# Patient Record
Sex: Female | Born: 1960 | Race: White | Hispanic: No | Smoking: Current every day smoker
Health system: Southern US, Community
[De-identification: ages and names within clinical notes are randomized; demographics above are authoritative.]

## PROBLEM LIST (undated history)

## (undated) DIAGNOSIS — F32A Depression, unspecified: Secondary | ICD-10-CM

## (undated) DIAGNOSIS — F419 Anxiety disorder, unspecified: Secondary | ICD-10-CM

## (undated) DIAGNOSIS — F329 Major depressive disorder, single episode, unspecified: Secondary | ICD-10-CM

---

## 2000-11-21 ENCOUNTER — Other Ambulatory Visit: Admission: RE | Admit: 2000-11-21 | Discharge: 2000-11-21 | Payer: Self-pay | Admitting: Family Medicine

## 2001-01-23 ENCOUNTER — Encounter: Admission: RE | Admit: 2001-01-23 | Discharge: 2001-02-16 | Payer: Self-pay | Admitting: Family Medicine

## 2002-02-25 ENCOUNTER — Other Ambulatory Visit: Admission: RE | Admit: 2002-02-25 | Discharge: 2002-02-25 | Payer: Self-pay | Admitting: Obstetrics & Gynecology

## 2002-04-20 ENCOUNTER — Encounter: Payer: Self-pay | Admitting: Emergency Medicine

## 2002-04-20 ENCOUNTER — Emergency Department (HOSPITAL_COMMUNITY): Admission: EM | Admit: 2002-04-20 | Discharge: 2002-04-20 | Payer: Self-pay | Admitting: Emergency Medicine

## 2004-03-16 ENCOUNTER — Other Ambulatory Visit: Admission: RE | Admit: 2004-03-16 | Discharge: 2004-03-16 | Payer: Self-pay | Admitting: Obstetrics & Gynecology

## 2005-04-01 ENCOUNTER — Ambulatory Visit: Payer: Self-pay | Admitting: Psychiatry

## 2005-04-27 ENCOUNTER — Emergency Department (HOSPITAL_COMMUNITY): Admission: EM | Admit: 2005-04-27 | Discharge: 2005-04-27 | Payer: Self-pay | Admitting: Emergency Medicine

## 2005-04-27 ENCOUNTER — Inpatient Hospital Stay (HOSPITAL_COMMUNITY): Admission: RE | Admit: 2005-04-27 | Discharge: 2005-05-03 | Payer: Self-pay | Admitting: Psychiatry

## 2017-05-15 ENCOUNTER — Inpatient Hospital Stay (HOSPITAL_COMMUNITY)
Admission: AD | Admit: 2017-05-15 | Discharge: 2017-05-18 | DRG: 885 | Disposition: A | Discharge status: Home / Self Care | Payer: Medicare Other | Source: Intra-hospital | Attending: Psychiatry | Admitting: Psychiatry

## 2017-05-15 ENCOUNTER — Encounter (HOSPITAL_COMMUNITY): Payer: Self-pay | Admitting: Emergency Medicine

## 2017-05-15 ENCOUNTER — Emergency Department (HOSPITAL_COMMUNITY)
Admission: EM | Admit: 2017-05-15 | Discharge: 2017-05-15 | Disposition: A | Discharge status: Other Acute Inpatient Hospital | Payer: Medicare Other | Attending: Emergency Medicine | Admitting: Emergency Medicine

## 2017-05-15 ENCOUNTER — Encounter (HOSPITAL_COMMUNITY): Payer: Self-pay

## 2017-05-15 ENCOUNTER — Other Ambulatory Visit: Payer: Self-pay

## 2017-05-15 DIAGNOSIS — Z791 Long term (current) use of non-steroidal anti-inflammatories (NSAID): Secondary | ICD-10-CM

## 2017-05-15 DIAGNOSIS — F332 Major depressive disorder, recurrent severe without psychotic features: Secondary | ICD-10-CM | POA: Diagnosis present

## 2017-05-15 DIAGNOSIS — Z79899 Other long term (current) drug therapy: Secondary | ICD-10-CM | POA: Insufficient documentation

## 2017-05-15 DIAGNOSIS — F419 Anxiety disorder, unspecified: Secondary | ICD-10-CM | POA: Insufficient documentation

## 2017-05-15 DIAGNOSIS — G4700 Insomnia, unspecified: Secondary | ICD-10-CM | POA: Diagnosis present

## 2017-05-15 DIAGNOSIS — F1910 Other psychoactive substance abuse, uncomplicated: Secondary | ICD-10-CM | POA: Diagnosis not present

## 2017-05-15 DIAGNOSIS — R45851 Suicidal ideations: Secondary | ICD-10-CM | POA: Diagnosis present

## 2017-05-15 DIAGNOSIS — Z87891 Personal history of nicotine dependence: Secondary | ICD-10-CM | POA: Diagnosis not present

## 2017-05-15 DIAGNOSIS — F329 Major depressive disorder, single episode, unspecified: Secondary | ICD-10-CM | POA: Diagnosis present

## 2017-05-15 DIAGNOSIS — F1010 Alcohol abuse, uncomplicated: Secondary | ICD-10-CM | POA: Diagnosis not present

## 2017-05-15 DIAGNOSIS — F1290 Cannabis use, unspecified, uncomplicated: Secondary | ICD-10-CM | POA: Diagnosis present

## 2017-05-15 DIAGNOSIS — F10230 Alcohol dependence with withdrawal, uncomplicated: Secondary | ICD-10-CM | POA: Diagnosis present

## 2017-05-15 DIAGNOSIS — R450 Nervousness: Secondary | ICD-10-CM | POA: Diagnosis not present

## 2017-05-15 DIAGNOSIS — F1210 Cannabis abuse, uncomplicated: Secondary | ICD-10-CM | POA: Diagnosis not present

## 2017-05-15 DIAGNOSIS — Z046 Encounter for general psychiatric examination, requested by authority: Secondary | ICD-10-CM | POA: Diagnosis not present

## 2017-05-15 HISTORY — DX: Major depressive disorder, single episode, unspecified: F32.9

## 2017-05-15 HISTORY — DX: Anxiety disorder, unspecified: F41.9

## 2017-05-15 HISTORY — DX: Depression, unspecified: F32.A

## 2017-05-15 LAB — COMPREHENSIVE METABOLIC PANEL
ALBUMIN: 3.7 g/dL (ref 3.5–5.0)
ALK PHOS: 57 U/L (ref 38–126)
ALT: 10 U/L — AB (ref 14–54)
AST: 19 U/L (ref 15–41)
Anion gap: 8 (ref 5–15)
CALCIUM: 8.9 mg/dL (ref 8.9–10.3)
CHLORIDE: 100 mmol/L — AB (ref 101–111)
CO2: 27 mmol/L (ref 22–32)
CREATININE: 0.81 mg/dL (ref 0.44–1.00)
GFR calc non Af Amer: >60 mL/min (ref >60)
Glucose, Bld: 96 mg/dL (ref 65–99)
Potassium: 3.8 mmol/L (ref 3.5–5.1)
SODIUM: 135 mmol/L (ref 135–145)
Total Bilirubin: 0.8 mg/dL (ref 0.3–1.2)
Total Protein: 7.1 g/dL (ref 6.5–8.1)

## 2017-05-15 LAB — CBC
HEMATOCRIT: 40.9 % (ref 36.0–46.0)
HEMOGLOBIN: 14.6 g/dL (ref 12.0–15.0)
MCH: 32.5 pg (ref 26.0–34.0)
MCHC: 35.7 g/dL (ref 30.0–36.0)
MCV: 91.1 fL (ref 78.0–100.0)
Platelets: 186 10*3/uL (ref 150–400)
RBC: 4.49 MIL/uL (ref 3.87–5.11)
RDW: 12.2 % (ref 11.5–15.5)
WBC: 4.8 10*3/uL (ref 4.0–10.5)

## 2017-05-15 LAB — RAPID URINE DRUG SCREEN, HOSP PERFORMED
AMPHETAMINES: NOT DETECTED
BARBITURATES: NOT DETECTED
Benzodiazepines: POSITIVE — AB
COCAINE: NOT DETECTED
OPIATES: NOT DETECTED
TETRAHYDROCANNABINOL: POSITIVE — AB

## 2017-05-15 LAB — SALICYLATE LEVEL

## 2017-05-15 LAB — ETHANOL: Alcohol, Ethyl (B): <10 mg/dL (ref <10)

## 2017-05-15 LAB — ACETAMINOPHEN LEVEL: Acetaminophen (Tylenol), Serum: <10 ug/mL — ABNORMAL LOW (ref 10–30)

## 2017-05-15 MED ORDER — HYDROXYZINE HCL 25 MG PO TABS: 25.0000 mg | ORAL_TABLET | Freq: Four times a day (QID) | ORAL | Status: DC | PRN

## 2017-05-15 MED ORDER — ALUM & MAG HYDROXIDE-SIMETH 200-200-20 MG/5ML PO SUSP: 30.0000 mL | ORAL | Status: DC | PRN

## 2017-05-15 MED ORDER — THIAMINE HCL 100 MG/ML IJ SOLN: 100.0000 mg | Freq: Once | INTRAMUSCULAR | Status: DC

## 2017-05-15 MED ORDER — LORAZEPAM 1 MG PO TABS: 1.0000 mg | ORAL_TABLET | Freq: Four times a day (QID) | ORAL | Status: DC | PRN

## 2017-05-15 MED ORDER — HYDROXYZINE HCL 25 MG PO TABS
25.0000 mg | ORAL_TABLET | Freq: Four times a day (QID) | ORAL | Status: DC | PRN
  Administered 2017-05-16: 25 mg via ORAL
  Filled 2017-05-15: qty 1

## 2017-05-15 MED ORDER — LOPERAMIDE HCL 2 MG PO CAPS: 2.0000 mg | ORAL_CAPSULE | ORAL | Status: DC | PRN

## 2017-05-15 MED ORDER — CLONIDINE HCL 0.1 MG PO TABS
0.1000 mg | ORAL_TABLET | Freq: Four times a day (QID) | ORAL | Status: DC
  Filled 2017-05-15 (×2): qty 1

## 2017-05-15 MED ORDER — ADULT MULTIVITAMIN W/MINERALS CH
1.0000 | ORAL_TABLET | Freq: Every day | ORAL | Status: DC
  Administered 2017-05-16 – 2017-05-18 (×3): 1 via ORAL
  Filled 2017-05-15 (×5): qty 1

## 2017-05-15 MED ORDER — TERBINAFINE HCL 250 MG PO TABS
250.0000 mg | ORAL_TABLET | Freq: Every day | ORAL | Status: DC
  Filled 2017-05-15: qty 1

## 2017-05-15 MED ORDER — CLONIDINE HCL 0.1 MG PO TABS: 0.1000 mg | ORAL_TABLET | Freq: Every day | ORAL | Status: DC

## 2017-05-15 MED ORDER — ATORVASTATIN CALCIUM 20 MG PO TABS
20.0000 mg | ORAL_TABLET | Freq: Every day | ORAL | Status: DC
  Filled 2017-05-15: qty 1

## 2017-05-15 MED ORDER — NAPROXEN 500 MG PO TABS: 500.0000 mg | ORAL_TABLET | Freq: Two times a day (BID) | ORAL | Status: DC | PRN

## 2017-05-15 MED ORDER — TRAZODONE HCL 100 MG PO TABS: 200.0000 mg | ORAL_TABLET | Freq: Every day | ORAL | Status: DC

## 2017-05-15 MED ORDER — ONDANSETRON 4 MG PO TBDP: 4.0000 mg | ORAL_TABLET | Freq: Four times a day (QID) | ORAL | Status: DC | PRN

## 2017-05-15 MED ORDER — VITAMIN B-1 100 MG PO TABS
100.0000 mg | ORAL_TABLET | Freq: Every day | ORAL | Status: DC
  Administered 2017-05-16 – 2017-05-18 (×3): 100 mg via ORAL
  Filled 2017-05-15 (×5): qty 1

## 2017-05-15 MED ORDER — IBUPROFEN 600 MG PO TABS
600.0000 mg | ORAL_TABLET | Freq: Four times a day (QID) | ORAL | Status: DC | PRN
  Administered 2017-05-15 – 2017-05-17 (×4): 600 mg via ORAL
  Filled 2017-05-15 (×4): qty 1

## 2017-05-15 MED ORDER — DICYCLOMINE HCL 20 MG PO TABS: 20.0000 mg | ORAL_TABLET | Freq: Four times a day (QID) | ORAL | Status: DC | PRN

## 2017-05-15 MED ORDER — TRAZODONE HCL 100 MG PO TABS
200.0000 mg | ORAL_TABLET | Freq: Every evening | ORAL | Status: DC | PRN
  Administered 2017-05-15: 200 mg via ORAL
  Filled 2017-05-15: qty 2

## 2017-05-15 MED ORDER — METHOCARBAMOL 500 MG PO TABS: 500.0000 mg | ORAL_TABLET | Freq: Three times a day (TID) | ORAL | Status: DC | PRN

## 2017-05-15 MED ORDER — CLONIDINE HCL 0.1 MG PO TABS: 0.1000 mg | ORAL_TABLET | ORAL | Status: DC

## 2017-05-15 MED ORDER — ACETAMINOPHEN 325 MG PO TABS: 650.0000 mg | ORAL_TABLET | Freq: Four times a day (QID) | ORAL | Status: DC | PRN

## 2017-05-15 MED ORDER — TERBINAFINE HCL 250 MG PO TABS
250.0000 mg | ORAL_TABLET | Freq: Every day | ORAL | Status: AC
  Administered 2017-05-16 – 2017-05-17 (×2): 250 mg via ORAL
  Filled 2017-05-15 (×2): qty 1

## 2017-05-15 MED ORDER — MAGNESIUM HYDROXIDE 400 MG/5ML PO SUSP: 30.0000 mL | Freq: Every day | ORAL | Status: DC | PRN

## 2017-05-15 MED ORDER — VORTIOXETINE HBR 5 MG PO TABS
1.0000 | ORAL_TABLET | Freq: Two times a day (BID) | ORAL | Status: DC
  Filled 2017-05-15: qty 1

## 2017-05-15 NOTE — ED Triage Notes (Signed)
Patient [place] [address] [place] her husband and drove in to [place] today to see her doctor who sent her to the ED for suicidal ideation.  Patient reports she has had depression and anxiety all her life and takes her medication, but it recently has stopped working.  Denies AV hallucinations or homicidal ideation.

## 2017-05-15 NOTE — Progress Notes (Signed)
Patient ID: Rebecca Wilson, female   DOB: [birth year], 56 y.o.   MRN: [id number] Per State regulations 482.30 this chart was reviewed for medical necessity with respect to the patient's admission/duration of stay.    Next review date: 05/19/17  Thurman CoyerEric Annete Ayuso, BSN, RN-BC  Case Manager

## 2017-05-15 NOTE — ED Notes (Signed)
Patient moved to room 34 in acute area.  Belongings given to psych tech.

## 2017-05-15 NOTE — BH Assessment (Signed)
[facility] Assessment Progress Note  Case was staffed with Lewis FNP who recommended a inpatient admission as appropriate bed placement is investigated.     

## 2017-05-15 NOTE — Tx Team (Signed)
Initial Treatment Plan 05/15/2017 7:26 PM Radene JourneyKay L Rachal ZOX:[id number]    PATIENT STRESSORS: Loss of two infants Other: step son living with her and her husband   PATIENT STRENGTHS: Capable of independent living Licensed conveyancerCommunication skills Financial means Physical Health   PATIENT IDENTIFIED PROBLEMS: Depression  Suicidal ideation  Substance abuse  "I just don't want to be depressed anymore"               DISCHARGE CRITERIA:  Improved stabilization in mood, thinking, and/or behavior Need for constant or close observation no longer present Verbal commitment to aftercare and medication compliance Withdrawal symptoms are absent or subacute and managed without 24-hour nursing intervention  PRELIMINARY DISCHARGE PLAN: Outpatient therapy Medication management  PATIENT/FAMILY INVOLVEMENT: This treatment plan has been presented to and reviewed with the patient, Radene JourneyKay L Winemiller.  The patient and family have been given the opportunity to ask questions and make suggestions.  Levin BaconHeather V Gaetano Romberger, RN 05/15/2017, 7:26 PM

## 2017-05-15 NOTE — BH Assessment (Addendum)
Assessment Note  Rebecca Wilson is an 56 y.o. female that presents this date with thoughts of self harm with intent but no plan. Patient is observed to be very tearful on presentation and speaks in a low soft voice. Patient is oriented to time/place and denies any H/I or AVH. Patient admits to three prior attempts at self harm and was last hospitalized in 2006 for a suicide attempt. Patient is very vague in reference to details and time frame associated with the two other attempts. Patient is a poor historian and renders conflicting history at times stating "I have been sick so long I can't remember anything anymore." Patient states she has been receiving services from Rebecca Wilson who assists with medication management. Patient cannot recall how long she has been with that provider but states "over 10 years." Patient denies having a therapist currently but reports she has had therapy "in the past" to assist with issues associated with depression. Patient is unable to identify any specific stressors this date stating she "just thinks her medications have stopped working." Patient reports she went to a scheduled appointment this date and informed Rebecca Wilson that she was feeling suicidal and was advised to present to [facility] to be evlauted. Patient is requesting a voluntary admission to assist with stabilization. Patient cannot currently contract for safety. Patient states she is currently compliant with her medication regimen but reports worsening symptoms of depression to include: frequent crying episodes, anhedonia and isolating. Patient reports a ongoing SA history to include daily alcohol for the last "few months" stating she consumes 4 to 6 beers daily with last use on 05/14/17 when she consumed 4 beers. Patient denies any other illicit SA use but tested positive for Cannabis this date. Patient denies any current withdrawals. Per notes, patient lives on [address] [place] and [place] in [place] today to see her  doctor who sent her to the ED for suicidal ideation. Patient reports she has had depression and anxiety all her life and takes her medication, but it recently has stopped working. Denies AV hallucinations or homicidal ideation. Case was staffed with Rebecca NethLewis Wilson who recommended a inpatient admission as appropriate bed placement is investigated.    Diagnosis: F33.2 MDD recurrent severe without psychotic features Alcohol use, Cannabis use   Past Medical History:  Past Medical History:  Diagnosis Date  . Anxiety   . Depression      Family History: No family history on file.  Social History:  has no tobacco, alcohol, and drug history on file.  Additional Social History:  Alcohol / Drug Use Pain Medications: See MAR Prescriptions: See MAR Over the Counter: See MAR History of alcohol / drug use?: Yes Longest period of sobriety (when/how long): Unknown Negative Consequences of Use: (Denies) Withdrawal Symptoms: (Denies) Substance #1 Name of Substance 1: Alcohol  1 - Age of First Use: 21 1 - Amount (size/oz): 6 to 8 12 oz beers 1 - Frequency: Daily 1 - Duration: Pt is vague in reference to time frame 1 - Last Use / Amount: 05/15/17 4 12  oz beers  CIWA: CIWA-Ar BP: 120/67 Pulse Rate: 61 COWS:    Allergies: No Known Allergies  Home Medications:  (Not in a hospital admission)  OB/GYN Status:  No LMP recorded. Patient has had a hysterectomy.  General Assessment Data [facility] of [facility]: [facility] TTS Assessment: In system Is this a Tele or Face-to-Face Assessment?: Face-to-Face Is this an Initial Assessment or a Re-assessment for this encounter?:  Initial Assessment Marital status: Married Villas name: NA Is patient pregnant?: No Pregnancy Status: No Living Arrangements: Spouse/significant other Can pt return to current living arrangement?: Yes Admission Status: Voluntary Is patient capable of signing voluntary admission?: Yes Referral Source: Self/Family/Friend Insurance  type: Medicare  Medical Screening Exam [facility] Walk-in ONLY) Medical Exam completed: Yes  Crisis Care Plan Living Arrangements: Spouse/significant other Legal Guardian: (NA) Name of Psychiatrist: [name] Wilson Name of Therapist: None  Education Status Is patient currently in school?: No Current Grade: (NA) Highest grade of school patient has completed: (12) Name of school: (NA) Contact person: (NA)  Risk to self with the past 6 months Suicidal Ideation: Yes-Currently Present Has patient been a risk to self within the past 6 months prior to admission? : No Suicidal Intent: Yes-Currently Present Has patient had any suicidal intent within the past 6 months prior to admission? : No Is patient at risk for suicide?: Yes Suicidal Plan?: No Has patient had any suicidal plan within the past 6 months prior to admission? : No Access to Means: No What has been your use of drugs/alcohol within the last 12 months?: Current use Previous Attempts/Gestures: Yes How many times?: 3 Other Self Harm Risks: NA Triggers for Past Attempts: Unknown Intentional Self Injurious Behavior: None Family Suicide History: Unknown Recent stressful life event(s): Other (Comment)(Pt states meds are no longer working) Persecutory voices/beliefs?: No Depression: Yes Depression Symptoms: Fatigue, Feeling worthless/self pity Substance abuse history and/or treatment for substance abuse?: Yes Suicide prevention information given to non-admitted patients: Not applicable  Risk to Others within the past 6 months Homicidal Ideation: No Does patient have any lifetime risk of violence toward others beyond the six months prior to admission? : No Thoughts of Harm to Others: No Current Homicidal Intent: No Current Homicidal Plan: No Access to Homicidal Means: No Identified Victim: NA History of harm to others?: No Assessment of Violence: None Noted Violent Behavior Description: NA Does patient have access to weapons?:  No Criminal Charges Pending?: No Does patient have a court date: No Is patient on probation?: No  Psychosis Hallucinations: None noted Delusions: None noted  Mental Status Report Appearance/Hygiene: In scrubs Eye Contact: Fair Motor Activity: Freedom of movement Speech: Soft, Slow Level of Consciousness: Quiet/awake Mood: Depressed Affect: Appropriate to circumstance Anxiety Level: Moderate Thought Processes: Coherent, Relevant Judgement: Unimpaired Orientation: Person, Place, Time Obsessive Compulsive Thoughts/Behaviors: None  Cognitive Functioning Concentration: Decreased Memory: Recent Intact, Remote Intact IQ: Average Insight: Fair Impulse Control: Fair Appetite: Poor Weight Loss: 0 Weight Gain: 0 Sleep: No Change Total Hours of Sleep: 6 Vegetative Symptoms: None  ADLScreening [facility] Assessment Services) Patient's cognitive ability adequate to safely complete daily activities?: Yes Patient able to express need for assistance with ADLs?: Yes Independently performs ADLs?: Yes (appropriate for developmental age)  Prior Inpatient Therapy Prior Inpatient Therapy: Yes Prior Therapy Dates: 2006 Prior Therapy Facilty/Provider([facility] Reason for Treatment: MH issues  Prior Outpatient Therapy Prior Outpatient Therapy: Yes Prior Therapy Dates: Ongoing Prior Therapy Facilty/Provider(s): Rebecca Kettle Wilson Reason for Treatment: Med mang Does patient have an ACCT team?: No Does patient have Intensive In-House Services?  : No Does patient have [facility] services? : No Does patient have P4CC services?: No  ADL Screening (condition at time of admission) Patient's cognitive ability adequate to safely complete daily activities?: Yes Is the patient deaf or have difficulty hearing?: No Does the patient have difficulty seeing, even when wearing glasses/contacts?: No Does the patient have difficulty concentrating, remembering, or making decisions?: No Patient  able to express need for  assistance with ADLs?: Yes Does the patient have difficulty dressing or bathing?: No Independently performs ADLs?: Yes (appropriate for developmental age) Does the patient have difficulty walking or climbing stairs?: No Weakness of Legs: None Weakness of Arms/Hands: None  Home Assistive Devices/Equipment Home Assistive Devices/Equipment: None  Therapy Consults (therapy consults require a physician order) PT Evaluation Needed: No OT Evalulation Needed: No SLP Evaluation Needed: No Abuse/Neglect Assessment (Assessment to be complete while patient is alone) Physical Abuse: Denies Verbal Abuse: Denies Sexual Abuse: Denies Exploitation of patient/patient's resources: Denies Self-Neglect: Denies Values / Beliefs Cultural Requests During Hospitalization: None Spiritual Requests During Hospitalization: None Consults Spiritual Care Consult Needed: No Social Work Consult Needed: No Merchant navy officerAdvance Directives (For Healthcare) Does Patient Have a Medical Advance Directive?: No Would patient like information on creating a medical advance directive?: No - Patient declined    Additional Information 1:1 In Past 12 Months?: No CIRT Risk: No Elopement Risk: No Does patient have medical clearance?: No     Disposition: Case was staffed with Rebecca NethLewis Wilson who recommended a inpatient admission as appropriate bed placement is investigated.  Disposition Initial Assessment Completed for this Encounter: Yes Disposition of Patient: Inpatient treatment program Type of inpatient treatment program: Adult  On Site Evaluation by:   Reviewed with Physician:    Alfredia Fergusonavid L Nahara Dona 05/15/2017 4:21 PM

## 2017-05-15 NOTE — ED Notes (Signed)
Pt oriented to room and unit. Pt is tearful and soft spoken.  Pt contracts for safety.  Pt denies S/I, H/I, and AVH.  15 minute checks and video monitoring in place.

## 2017-05-15 NOTE — Progress Notes (Signed)
Patient ID: Rebecca Wilson, female   DOB: Apr 26, 1961, 56 y.o.   MRN: [id number]  Pt currently presents with a worried affect and anxious behavior. Pt has concerns about being started on medications for her chronic hip pain and sleep. Pt speaks to her husband on the phone tonight. Pt reports good sleep with current medication regimen.   Pt provided with medications per providers orders. Pt's labs and vitals were monitored throughout the night. Pt given a 1:1 about emotional and mental status. Pt supported and encouraged to express concerns and questions. Pt educated on medications. Provider notified of patients concerns, see MAR.   Pt's safety ensured with 15 minute and environmental checks. Pt currently denies SI/HI and A/V hallucinations. Pt verbally agrees to seek staff if SI/HI or A/VH occurs and to consult with staff before acting on any harmful thoughts. Will continue POC.

## 2017-05-15 NOTE — ED Notes (Signed)
Pt transferred safely with [place] driver.  Pt was in no distress at discharge.  All belongings were sent with patient.

## 2017-05-15 NOTE — BH Assessment (Signed)
[facility] Assessment Progress Note  Per Hillery Jacksanika Lewis, NP, this pt requires psychiatric hospitalization at this time.  Malva LimesLinsey Strader, RN, [facility].  Pt has signed Voluntary Admission and Consent for Treatment, as well as Consent to Release Information to her husband and to her provider in [place], and signed forms have [facility].  Pt's nurse, Kendal Hymendie, has been notified, and agrees to send original paperwork along with pt via [facility] [place], and to call report to [phone].  Doylene Canninghomas Kani Jobson, MA Triage Specialist [phone]

## 2017-05-15 NOTE — Progress Notes (Signed)
Rebecca Wilson is a 56 year old female being admitted voluntarily to [facility] from [facility].  She came to the ED with suicidal ideation with no plan or intent.  She has history of suicide attempt in the past.  She denied HI or A/V hallucinations.  She reported that her medications aren't working like they were.  She is drinking daily, 4-6 beers per night.  She reported that her stressors are stepson is currently living with her and her husband is an alcoholic.  [facility], she continues to voice suicidal ideation with no specific plan and she will contact for safety on the unit.  No HI or A/V hallucinations.   She denies any pain or discomfort and appears to be in no physical distress.  Oriented her to the unit.  Admission paperwork completed and signed.  Belongings searched and secured in locker # 42.  Skin assessment completed and no skin issues noted.  Q 15 minute checks initiated for safety.  We will monitor the progress towards her goals.

## 2017-05-15 NOTE — ED Provider Notes (Signed)
[facility]-EMERGENCY DEPT Provider Note   CSN: [id number] Arrival date & time: 05/15/17  1048     History   Chief Complaint Chief Complaint  Patient presents with  . Suicidal    HPI Rebecca Wilson is a 56 y.o. female with long history of anxiety and depression presents for evaluation of suicidal thoughts. She was sent to the ED by primary care provider earlier this morning. She reports ongoing dysphoric mood, dip depression, anxiety, daily thoughts of ending her life. She does not want to live anymore. She reports drinking 6-8 beers nightly. She denies other illicit drug use. Does not have specific plans of suicide. He has been on chronic psychotropic medications for anxiety and depression, states that they're not working for her. She denies any other systemic symptoms including fever, chest pain, shortness of breath, abdominal pain, urinary symptoms, headache. She denies auditory hallucinations, homicidal hallucinations.  HPI  Past Medical History:  Diagnosis Date  . Anxiety   . Depression     There are no active problems to display for this patient.    The histories are not reviewed yet. Please review them in the "History" navigator section and refresh this SmartLink.  OB History    No data available       Home Medications    Prior to Admission medications   Medication Sig Start Date End Date Taking? Authorizing Provider  albuterol (PROVENTIL HFA;VENTOLIN HFA) 108 (90 Base) MCG/ACT inhaler Inhale 2 puffs into the lungs every 6 (six) hours as needed for wheezing or shortness of breath.   Yes [provider]  atorvastatin (LIPITOR) 20 MG tablet Take 20 mg by mouth daily.   Yes [provider]  fluticasone furoate-vilanterol (BREO ELLIPTA) 100-25 MCG/INH AEPB Inhale 1 puff into the lungs daily.   Yes [provider]  ibuprofen (ADVIL,MOTRIN) 200 MG tablet Take 200-400 mg by mouth every 6 (six) hours as needed for moderate  pain.   Yes [provider]  meloxicam (MOBIC) 7.5 MG tablet Take 7.5 mg by mouth 2 (two) times daily.   Yes [provider]  terbinafine (LAMISIL) 250 MG tablet Take 250 mg by mouth daily.   Yes [provider]  traZODone (DESYREL) 100 MG tablet Take 200 mg by mouth at bedtime.   Yes [provider]  vortioxetine HBr (TRINTELLIX) 10 MG TABS Take 1 tablet by mouth 2 (two) times daily.   Yes [provider]    Family History No family history on file.  Social History Social History   Tobacco Use  . Smoking status: Not on file  Substance Use Topics  . Alcohol use: Not on file  . Drug use: Not on file     Allergies   Patient has no known allergies.   Review of Systems Review of Systems  Psychiatric/Behavioral: Positive for dysphoric mood and suicidal ideas. The patient is nervous/anxious.   All other systems reviewed and are negative.    Physical Exam Updated Vital Signs BP 120/67 (BP Location: Right Arm)   Pulse 61   Temp 98.5 F (36.9 C) (Oral)   Resp 16   SpO2 100%   Physical Exam  Constitutional: She is oriented to person, place, and time. She appears well-developed and well-nourished. No distress.  NAD.  HENT:  Head: Normocephalic and atraumatic.  Right Ear: External ear normal.  Left Ear: External ear normal.  Nose: Nose normal.  Eyes: Conjunctivae and EOM are normal. No scleral icterus.  Neck:  Normal range of motion. Neck supple.  Cardiovascular: Normal rate, regular rhythm and normal heart sounds.  No murmur heard. Pulmonary/Chest: Effort normal and breath sounds normal. She has no wheezes.  Abdominal: Soft. There is no tenderness.  Musculoskeletal: Normal range of motion. She exhibits no deformity.  Neurological: She is alert and oriented to person, place, and time.  Skin: Skin is warm and dry. Capillary refill takes less than 2 seconds.  Psychiatric: Thought content normal.  Teary-eyed. Avoids eye contact.  Admits to suicidal thoughts. Denies hallucinations, homicidal ideations.  Nursing note and vitals reviewed.    ED Treatments / Results  Labs (all labs ordered are listed, but only abnormal results are displayed) Labs Reviewed  COMPREHENSIVE METABOLIC PANEL - Abnormal; Notable for the following components:      Result Value   Chloride 100 (*)    BUN <5 (*)    ALT 10 (*)    All other components within normal limits  ACETAMINOPHEN LEVEL - Abnormal; Notable for the following components:   Acetaminophen (Tylenol), Serum <10 (*)    All other components within normal limits  RAPID URINE DRUG SCREEN, HOSP PERFORMED - Abnormal; Notable for the following components:   Benzodiazepines POSITIVE (*)    Tetrahydrocannabinol POSITIVE (*)    All other components within normal limits  ETHANOL  SALICYLATE LEVEL  CBC    EKG  EKG Interpretation None       Radiology No results found.  Procedures Procedures (including critical care time)  Medications Ordered in ED Medications - No data to display   Initial Impression / Assessment and Plan / ED Course  I have reviewed the triage vital signs and the nursing notes.  Pertinent labs & imaging results that were available during my care of the patient were reviewed by me and considered in my medical decision making (see chart for details).     56 year old female with long history of anxiety and depression with previous suicidal attempts presents to ED for evaluation of active suicidal thoughts, no specific plan. Was sent to ED by PCP for evaluation. Also reports heavy, daily use of EtOH. Denies withdrawal symptoms in the past. She denies hallucinations or homicidal ideations. On exam, vital signs are within normal limits. She is teary-eyed. Exam otherwise unremarkable.  Labwork remarkable for benzodiazepines and marijuana on urine drug screen, otherwise unremarkable. At this time, patient is medically cleared. Pending TTS consult.  Final  Clinical Impressions(s) / ED Diagnoses   Patient medically cleared. TTS recommending inpatient tx. She is awaiting trasnfer.  Final diagnoses:  Suicidal ideation    ED Discharge Orders    None         Jerrell MylarGibbons, Kieren Ricci J, PA-C 05/15/17 1625    Mancel BaleWentz, Elliott, MD 05/15/17 [phone]

## 2017-05-16 DIAGNOSIS — G4700 Insomnia, unspecified: Secondary | ICD-10-CM

## 2017-05-16 DIAGNOSIS — R450 Nervousness: Secondary | ICD-10-CM

## 2017-05-16 DIAGNOSIS — R45851 Suicidal ideations: Secondary | ICD-10-CM

## 2017-05-16 DIAGNOSIS — F1210 Cannabis abuse, uncomplicated: Secondary | ICD-10-CM

## 2017-05-16 DIAGNOSIS — F419 Anxiety disorder, unspecified: Secondary | ICD-10-CM

## 2017-05-16 DIAGNOSIS — F332 Major depressive disorder, recurrent severe without psychotic features: Principal | ICD-10-CM

## 2017-05-16 DIAGNOSIS — F1010 Alcohol abuse, uncomplicated: Secondary | ICD-10-CM

## 2017-05-16 MED ORDER — ESCITALOPRAM OXALATE 10 MG PO TABS
10.0000 mg | ORAL_TABLET | Freq: Every day | ORAL | Status: DC
  Administered 2017-05-16 – 2017-05-18 (×3): 10 mg via ORAL
  Filled 2017-05-16 (×7): qty 1

## 2017-05-16 MED ORDER — TRAZODONE HCL 100 MG PO TABS
100.0000 mg | ORAL_TABLET | Freq: Every evening | ORAL | Status: DC | PRN
  Administered 2017-05-16: 100 mg via ORAL
  Filled 2017-05-16: qty 1

## 2017-05-16 MED ORDER — HYDROXYZINE HCL 50 MG PO TABS
50.0000 mg | ORAL_TABLET | Freq: Four times a day (QID) | ORAL | Status: DC | PRN
  Administered 2017-05-17 (×3): 50 mg via ORAL
  Filled 2017-05-16 (×3): qty 1

## 2017-05-16 NOTE — Plan of Care (Signed)
Nurse discussed anxiety, depression, coping skills with patient. 

## 2017-05-16 NOTE — Progress Notes (Signed)
Patient's self inventory sheet, patient has fair sleep, sleep medication helpful.  Fair appetite, low energy level, good concentration.  Rated depression and anxiety 10, hopeless 5.  Denied withdrawals.  Denied SI.  Denied physical problems.  Physical pain, worst pain in past 24 hours is #3, hip, pain medication helpful.  Goal is improve depression,.  No discharge plans.

## 2017-05-16 NOTE — Progress Notes (Signed)
Recreation Therapy Notes  Animal-Assisted Activity (AAA) Program Checklist/Progress Notes Patient Eligibility Criteria Checklist & Daily Group note for Rec TxIntervention  Date: 12.18.2018 Time: 2:45pm [address]   AAA/T Program Assumption of Risk Form signed by Patient/ or Parent Legal Guardian Yes  Patient is free of allergies or sever asthma Yes  Patient reports no fear of animals Yes  Patient reports no history of cruelty to animals Yes  Patient understands his/her participation is voluntary Yes  Behavioral Response: Did not attend.   Marykay Lexenise L Chez Bulnes, LRT/CTRS         Noemi Ishmael L 05/16/2017 3:04 PM

## 2017-05-16 NOTE — [facility] Group Notes (Signed)
Pt did not attend wrap up group this evening. Pt laid in bed instead.

## 2017-05-16 NOTE — Progress Notes (Signed)
Patient ID: Rebecca Wilson, female   DOB: Apr 03, 1961, 56 y.o.   MRN: [id number]   D: Patient has a flat affect on approach tonight. Reports mood better than on admission. No physical issues tonight or withdrawal symptoms. No active SI. Contracts on unit. A: Staff will continue to monitor on q 15 minute checks, follow treatment plan, and give meds as ordered. R: Cooperative on the unit.

## 2017-05-16 NOTE — Progress Notes (Signed)
D:  Patient denied SI and HI, contracts for safety.  Denied A/V hallucinations. A:  Medications administered per MD orders.  Emotional support and encouragement given patient. R:  Safety maintained with 15 minute checks. Patient has stayed in bed most of the day resting.

## 2017-05-16 NOTE — [facility] Suicide Risk Assessment (Signed)
[facility]   Nursing information obtained from:  Patient Demographic factors:  Caucasian, Unemployed Current Mental Status:  Self-harm thoughts Loss Factors:  Loss of significant relationship, Financial problems / change in socioeconomic status Historical Factors:  Prior suicide attempts, Family history of mental illness or substance abuse Risk Reduction Factors:  Living with another person, especially a relative  Total Time spent with patient: 45 minutes Principal Problem: MDD (major depressive disorder), recurrent episode, severe (HCC) Diagnosis:   Patient Active Problem List   Diagnosis Date Noted  . MDD (major depressive disorder), recurrent episode, severe (HCC) [F33.2] 05/15/2017   Subjective Data:  Rebecca Wilson is a 56 y/o F with history of MDD and alcohol abuse who was admitted with worsening depression, SI with plan to overdose, and worsening symptoms of alcohol abuse. When asked about why she came to the hospital, pt shares, "It's mainly depression - I haven't been able to cope well after the last three or four months." Pt reports feeling overwhelmed, guilty, and frustrated. She endorses anhedonia, low energy, poor concentration, poor appetite (unknown if having recent weight loss), poor concentration, and psychomotor retardation. She endorses SI without specific plan, and when asked about a specific plan, she replies, "I guess I would overdose." She denies HI. She denies AH/VH. She denies symptoms of mania, OCD, and PTSD. She reports drinking about 5-6 cans of 12-ounce beers daily. She denies history of alcohol withdrawal.  Discussed with patient about treatment options. She reports that she [place] up [place], and recently she has been prescribed trintellix 20mg  qDay, xanax 0.5mg  TID, trazodone 200mg  qhs, and latuda. She stopped taking latuda on her own about 1 month ago because it was not helpful. She has been on trilntellix for  about a year and she notes that it has never been helpful for her. She sometimes will not take the xanax, and we discussed attempting trial of a different medication for as needed treatment of anxiety. Pt reports previous trials of zoloft, paxil, wellbutrin, prozac, and seroquel - all of which were not helpful. Pt denies any specific episodes of decreased need for sleep or episodes resembling a manic episode in her history aside from when she had used illicit substances in the past and stayed up for 3 days. Discussed with patient about attempting trial of lexapro and discontinuing trintellix, and she was in agreement. We also discussed using vistaril at a higher dose for as needed treatment of anxiety and insomnia, and pt was in agreement to attempt that trial as well as lower dose of trazodone. She will be started on CIWA and monitored for signs/symptoms of alcohol withdrawal. Pt was in agreement with the above plan, and she had no further questions, comments, or concerns.  Continued Clinical Symptoms:  Alcohol Use Disorder Identification Test Final Score (AUDIT): 14 The "Alcohol Use Disorders Identification Test", Guidelines for Use in Primary Care, Second Edition.  World Science [employer] [facility](WHO). Score between 0-7:  no or low risk or alcohol related problems. Score between 8-15:  moderate risk of alcohol related problems. Score between 16-19:  high risk of alcohol related problems. Score 20 or above:  warrants further diagnostic evaluation for alcohol dependence and treatment.   CLINICAL FACTORS:   Severe Anxiety and/or Agitation Depression:   Comorbid alcohol abuse/dependence Alcohol/Substance Abuse/Dependencies More than one psychiatric diagnosis Previous Psychiatric Diagnoses and Treatments Medical Diagnoses and Treatments/Surgeries   Musculoskeletal: Strength & Muscle Tone: within normal limits Gait &  Station: normal Patient leans: N/A  Psychiatric Specialty Exam: Physical Exam   Nursing note and vitals reviewed.   Review of Systems  Constitutional: Negative for fever.  Respiratory: Negative for cough and shortness of breath.   Cardiovascular: Negative for chest pain.  Gastrointestinal: Negative for abdominal pain, heartburn, nausea and vomiting.  Psychiatric/Behavioral: Positive for depression, substance abuse and suicidal ideas. Negative for hallucinations. The patient is nervous/anxious.     Blood pressure 100/62, pulse 70, temperature 97.9 F (36.6 C), temperature source Oral, resp. rate 16, height 5' 4.5" (1.638 m), weight 66.2 kg (146 lb), SpO2 100 %.Body mass index is 24.67 kg/m.  General Appearance: Casual and Fairly Groomed  Eye Contact:  Good  Speech:  Clear and Coherent and Normal Rate  Volume:  Normal  Mood:  Anxious and Depressed  Affect:  Appropriate, Congruent, Constricted and Tearful  Thought Process:  Coherent and Goal Directed  Orientation:  Full (Time, Place, and Person)  Thought Content:  Logical  Suicidal Thoughts:  Yes.  with intent/plan  Homicidal Thoughts:  No  Memory:  Immediate;   Good Recent;   Good Remote;   Good  Judgement:  Fair  Insight:  Fair  Psychomotor Activity:  Normal  Concentration:  Concentration: Fair  Recall:  Fair  Fund of Knowledge:  Good  Language:  Fair  Akathisia:  No  Handed:    AIMS (if indicated):     Assets:  Communication Skills Resilience Social Support Talents/Skills  ADL's:  Intact  Cognition:  WNL  Sleep:  Number of Hours: 6    COGNITIVE FEATURES THAT CONTRIBUTE TO RISK:  None    SUICIDE RISK:   Mild:  Suicidal ideation of limited frequency, intensity, duration, and specificity.  There are no identifiable plans, no associated intent, mild dysphoria and related symptoms, good self-control (both objective and subjective assessment), few other risk factors, and identifiable protective factors, including available and accessible social support.  PLAN OF CARE:  -admit to inpatient level of  care  -MDD recurrent, severe without psychotic features  - DC trintellix  - Start lexapro 10mg  qDay - Anxiety  - DC xanax  - Start vistaril 50mg  q6h prn anxiety/insomnia - Insomnia  - Change trazodone 200mg  qhs to trazodone 100mg  qhs  -  Start vistaril 50mg  q6h prn anxiety/insomnia - Alcohol withdrawal  - Start CIWA protocol with ativan  -Encourage participation in groups and the therapeutic milieu -Discharge planning will be ongoing  I certify that inpatient services furnished can reasonably be expected to improve the patient's condition.   Micheal Likenshristopher T Harleen Fineberg, MD 05/16/2017, 5:01 PM

## 2017-05-16 NOTE — H&P (Signed)
Psychiatric Admission Assessment Adult  Patient Identification: Rebecca Wilson  MRN:  [id number]  Date of Evaluation:  05/16/2017  Chief Complaint: Worsening symptoms of depression & anxiety.  Principal Diagnosis: MDD (major depressive disorder), recurrent episode, severe ([place]), Alcohol abuse disorder, Cannabis use disorder   Diagnosis:   Patient Active Problem List   Diagnosis Date Noted  . MDD (major depressive disorder), recurrent episode, severe ([place]) [F33.2] 05/15/2017    Priority: High   History of Present Illness: Rebecca Wilson is a 56 y/o F with history of MDD and alcohol abuse who was admitted with worsening depression, SI with plan to overdose, and worsening symptoms of alcohol abuse.   Patient reported being overwhelmed about life in general. She stated, "I feel like I cant cope any longer". She reported her biggest stressor is her 19 y.o step son who is making life miserable for her. She reports step son is done with high school and does not want to go to college. She reports he stays home and brings all kinds of friends to the house, he does not clean the house and he is very disrespectful to her. She said her husband does not to anything regarding step son's behavior. Patient stated she has now moved out of the house and moved in with his son in [place] where she feels safe. Patient currently denies any suicide or homicide ideations and also denies visual and auditory hallucinations at this time. She stated that she is currently taking trintellix 20 mg and that is not completely effective. Patient is open to trial of a new medication and agrees to comply with treatment regimen.  Associated Signs/Symptoms:  Depression Symptoms:  depressed mood, insomnia, feelings of worthlessness/guilt, suicidal thoughts without plan, anxiety,  (Hypo) Manic Symptoms:  Irritable Mood, Labiality of Mood,  Anxiety Symptoms:  Excessive Worry,  Psychotic Symptoms:  Denies any  hallucinations, delusions or paranoia  PTSD Symptoms: Denies any PTSD symptoms or event  Total Time spent with patient: 1 hour  Past Psychiatric History: Major depression  Is the patient at risk to self? Yes.    Has the patient been a risk to self in the past 6 months? Yes.    Has the patient been a risk to self within the distant past? Yes.    Is the patient a risk to others? No.  Has the patient been a risk to others in the past 6 months? No.  Has the patient been a risk to others within the distant past? No.   Prior Inpatient Therapy: Yes (Sertraline, Paxil, Wellbutrin, Seroquel, Latuda) Prior Outpatient Therapy: Yes (Dr. Cena Benton)  Alcohol Screening: 1. How often do you have a drink containing alcohol?: 4 or more times a week 2. How many drinks containing alcohol do you have on a typical day when you are drinking?: 5 or 6 3. How often do you have six or more drinks on one occasion?: Daily or almost daily AUDIT-C Score: 10 4. How often during the last year have you found that you were not able to stop drinking once you had started?: Never 5. How often during the last year have you failed to do what was normally expected from you becasue of drinking?: Never 6. How often during the last year have you needed a first drink in the morning to get yourself going after a heavy drinking session?: Never 7. How often during the last year have you had a feeling of guilt of remorse after drinking?: Never 8. How often during  the last year have you been unable to remember what happened the night before because you had been drinking?: Never 9. Have you or someone else been injured as a result of your drinking?: No 10. Has a relative or friend or a doctor or another health worker been concerned about your drinking or suggested you cut down?: Yes, during the last year Alcohol Use Disorder Identification Test Final Score (AUDIT): 14 Intervention/Follow-up: Alcohol Education  Substance Abuse History in  the last 12 months:  Yes.    Consequences of Substance Abuse: Medical Consequences:  Risks for withdrawal Legal Consequences:  Artrests, jail times Family Consequences:  Marital separation or discord  Previous Psychotropic Medications: Paxil, wellbutrin, prozac, and seroquel  Psychological Evaluations: No   Past Medical History:  Past Medical History:  Diagnosis Date  . Anxiety   . Depression    History reviewed. No pertinent surgical history.  Family History: History reviewed. No pertinent family history.  Family Psychiatric  History:  None reported  Tobacco Screening: Have you used any form of tobacco in the last 30 days? (Cigarettes, Smokeless Tobacco, Cigars, and/or Pipes): No  Social History:  Social History   Substance and Sexual Activity  Alcohol Use Not on file     Social History   Substance and Sexual Activity  Drug Use Not on file    Additional Social History: Marital status: Married Number of Years Married: 7 What types of issues is patient dealing with in the relationship?: my husband won't kick his son out despite him causing major problems in our home. Additional relationship information: n/a  Are you sexually active?: Yes What is your sexual orientation?: heterosexual Has your sexual activity been affected by drugs, alcohol, medication, or emotional stress?: n/a  Does patient have children?: Yes How many children?: 2 How is patient's relationship with their children?: 76yo son (2 grandchildren) and 82yo daughter. "they live in [place] and I visit them often. " Pt reports that she lost 2 babies when they were born which she still thinks about often and is tearful when discussing.    Allergies:  No Known Allergies  Lab Results:  Results for orders placed or performed during the hospital encounter of 05/15/17 (from the past 48 hour(s))  Comprehensive metabolic panel     Status: Abnormal   Collection Time: 05/15/17 12:03 PM  Result Value Ref Range    Sodium 135 135 - 145 mmol/L   Potassium 3.8 3.5 - 5.1 mmol/L   Chloride 100 (L) 101 - 111 mmol/L   CO2 27 22 - 32 mmol/L   Glucose, Bld 96 65 - 99 mg/dL   BUN <5 (L) 6 - 20 mg/dL   Creatinine, Ser 0.81 0.44 - 1.00 mg/dL   Calcium 8.9 8.9 - 10.3 mg/dL   Total Protein 7.1 6.5 - 8.1 g/dL   Albumin 3.7 3.5 - 5.0 g/dL   AST 19 15 - 41 U/L   ALT 10 (L) 14 - 54 U/L   Alkaline Phosphatase 57 38 - 126 U/L   Total Bilirubin 0.8 0.3 - 1.2 mg/dL   GFR calc non Af Amer >60 >60 mL/min   GFR calc Af Amer >60 >60 mL/min    Comment: (NOTE) The eGFR has been calculated using the CKD EPI equation. This calculation has not been validated in all clinical situations. eGFR's persistently <60 mL/min signify possible Chronic Kidney Disease.    Anion gap 8 5 - 15  Ethanol     Status: None   Collection  Time: 05/15/17 12:03 PM  Result Value Ref Range   Alcohol, Ethyl (B) <10 <10 mg/dL    Comment:        LOWEST DETECTABLE LIMIT FOR SERUM ALCOHOL IS 10 mg/dL FOR MEDICAL PURPOSES ONLY   Salicylate level     Status: None   Collection Time: 05/15/17 12:03 PM  Result Value Ref Range   Salicylate Lvl <4.2 2.8 - 30.0 mg/dL  Acetaminophen level     Status: Abnormal   Collection Time: 05/15/17 12:03 PM  Result Value Ref Range   Acetaminophen (Tylenol), Serum <10 (L) 10 - 30 ug/mL    Comment:        THERAPEUTIC CONCENTRATIONS VARY SIGNIFICANTLY. A RANGE OF 10-30 ug/mL MAY BE AN EFFECTIVE CONCENTRATION FOR MANY PATIENTS. HOWEVER, SOME ARE BEST TREATED AT CONCENTRATIONS OUTSIDE THIS RANGE. ACETAMINOPHEN CONCENTRATIONS >150 ug/mL AT 4 HOURS AFTER INGESTION AND >50 ug/mL AT 12 HOURS AFTER INGESTION ARE OFTEN ASSOCIATED WITH TOXIC REACTIONS.   cbc     Status: None   Collection Time: 05/15/17 12:03 PM  Result Value Ref Range   WBC 4.8 4.0 - 10.5 K/uL   RBC 4.49 3.87 - 5.11 MIL/uL   Hemoglobin 14.6 12.0 - 15.0 g/dL   HCT 40.9 36.0 - 46.0 %   MCV 91.1 78.0 - 100.0 fL   MCH 32.5 26.0 - 34.0 pg   MCHC  35.7 30.0 - 36.0 g/dL   RDW 12.2 11.5 - 15.5 %   Platelets 186 150 - 400 K/uL  Rapid urine drug screen (hospital performed)     Status: Abnormal   Collection Time: 05/15/17  1:40 PM  Result Value Ref Range   Opiates NONE DETECTED NONE DETECTED   Cocaine NONE DETECTED NONE DETECTED   Benzodiazepines POSITIVE (A) NONE DETECTED   Amphetamines NONE DETECTED NONE DETECTED   Tetrahydrocannabinol POSITIVE (A) NONE DETECTED   Barbiturates NONE DETECTED NONE DETECTED    Comment:        DRUG SCREEN FOR MEDICAL PURPOSES ONLY.  IF CONFIRMATION IS NEEDED FOR ANY PURPOSE, NOTIFY LAB WITHIN 5 DAYS.        LOWEST DETECTABLE LIMITS FOR URINE DRUG SCREEN Drug Class       Cutoff (ng/mL) Amphetamine      1000 Barbiturate      200 Benzodiazepine   706 Tricyclics       237 Opiates          300 Cocaine          300 THC              50     Blood Alcohol level:  Lab Results  Component Value Date   ETH <10 62/83/1517   Metabolic Disorder Labs:  No results found for: HGBA1C, MPG No results found for: PROLACTIN No results found for: CHOL, TRIG, HDL, CHOLHDL, VLDL, LDLCALC  Current Medications: Current Facility-Administered Medications  Medication Dose Route Frequency Provider Last Rate Last Dose  . acetaminophen (TYLENOL) tablet 650 mg  650 mg Oral Q6H PRN Laverle Hobby, PA-C      . alum & mag hydroxide-simeth (MAALOX/MYLANTA) 200-200-20 MG/5ML suspension 30 mL  30 mL Oral Q4H PRN Patriciaann Clan E, PA-C      . escitalopram (LEXAPRO) tablet 10 mg  10 mg Oral Daily Pennelope Bracken, MD   10 mg at 05/16/17 1638  . hydrOXYzine (ATARAX/VISTARIL) tablet 50 mg  50 mg Oral Q6H PRN Pennelope Bracken, MD      . ibuprofen (  ADVIL,MOTRIN) tablet 600 mg  600 mg Oral Q6H PRN Laverle Hobby, PA-C   600 mg at 05/16/17 1344  . loperamide (IMODIUM) capsule 2-4 mg  2-4 mg Oral PRN Laverle Hobby, PA-C      . LORazepam (ATIVAN) tablet 1 mg  1 mg Oral Q6H PRN Patriciaann Clan E, PA-C      .  magnesium hydroxide (MILK OF MAGNESIA) suspension 30 mL  30 mL Oral Daily PRN Laverle Hobby, PA-C      . multivitamin with minerals tablet 1 tablet  1 tablet Oral Daily Laverle Hobby, PA-C   1 tablet at 05/16/17 0849  . ondansetron (ZOFRAN-ODT) disintegrating tablet 4 mg  4 mg Oral Q6H PRN Laverle Hobby, PA-C      . terbinafine (LAMISIL) tablet 250 mg  250 mg Oral Daily Patriciaann Clan E, PA-C   250 mg at 05/16/17 0849  . thiamine (B-1) injection 100 mg  100 mg Intramuscular Once Patriciaann Clan E, PA-C      . thiamine (VITAMIN B-1) tablet 100 mg  100 mg Oral Daily Patriciaann Clan E, PA-C   100 mg at 05/16/17 0849  . traZODone (DESYREL) tablet 100 mg  100 mg Oral QHS PRN Pennelope Bracken, MD       PTA Medications: Medications Prior to Admission  Medication Sig Dispense Refill Last Dose  . albuterol (PROVENTIL HFA;VENTOLIN HFA) 108 (90 Base) MCG/ACT inhaler Inhale 2 puffs into the lungs every 6 (six) hours as needed for wheezing or shortness of breath.   unknown  . atorvastatin (LIPITOR) 20 MG tablet Take 20 mg by mouth daily.   05/14/2017 at Unknown time  . fluticasone furoate-vilanterol (BREO ELLIPTA) 100-25 MCG/INH AEPB Inhale 1 puff into the lungs daily.   unknown  . ibuprofen (ADVIL,MOTRIN) 200 MG tablet Take 200-400 mg by mouth every 6 (six) hours as needed for moderate pain.   05/15/2017 at Unknown time  . meloxicam (MOBIC) 7.5 MG tablet Take 7.5 mg by mouth 2 (two) times daily.   05/14/2017 at Unknown time  . terbinafine (LAMISIL) 250 MG tablet Take 250 mg by mouth daily.   05/14/2017 at Unknown time  . traZODone (DESYREL) 100 MG tablet Take 200 mg by mouth at bedtime.   05/14/2017 at Unknown time  . vortioxetine HBr (TRINTELLIX) 10 MG TABS Take 1 tablet by mouth 2 (two) times daily.   05/14/2017 at Unknown time   Musculoskeletal: Strength & Muscle Tone: within normal limits Gait & Station: normal Patient leans: N/A  Psychiatric Specialty Exam: Physical Exam  Vitals  reviewed. Constitutional: She is oriented to person, place, and time. She appears well-developed and well-nourished.  HENT:  Head: Normocephalic and atraumatic.  Eyes: Pupils are equal, round, and reactive to light.  Neck: Normal range of motion.  Cardiovascular: Normal rate, regular rhythm and normal heart sounds.  Respiratory: Effort normal and breath sounds normal.  GI: Soft. Bowel sounds are normal.  Genitourinary:  Genitourinary Comments: Deferred  Musculoskeletal: Normal range of motion.  Neurological: She is alert and oriented to person, place, and time.  Skin: Skin is warm and dry.    Review of Systems  Constitutional: Positive for malaise/fatigue.  HENT: Negative.   Eyes: Negative.   Respiratory: Negative.   Cardiovascular: Negative.   Gastrointestinal: Negative.   Genitourinary: Negative.   Musculoskeletal: Negative.   Skin: Negative.   Neurological: Negative.   Endo/Heme/Allergies: Negative.   Psychiatric/Behavioral: Positive for depression, substance abuse (UDS (+) for Benzodiazepine &  THC) and suicidal ideas. Negative for hallucinations and memory loss. The patient is nervous/anxious and has insomnia.     Blood pressure 108/62, pulse 62, temperature 97.9 F (36.6 C), temperature source Oral, resp. rate 16, height 5' 4.5" (1.638 m), weight 66.2 kg (146 lb), SpO2 100 %.Body mass index is 24.67 kg/m.  General Appearance:Casual and Fairly Groomed  Eye Contact:Good  Speech:Clear and Coherent and Normal Rate  Volume:Normal  Mood:Anxious and Depressed  Affect:Appropriate, Congruent, Constricted and Tearful  Thought Process:Coherent and Goal Directed  Orientation:Full (Time, Place, and Person)  Thought Content:Logical  Suicidal Thoughts:Yes.with intent/plan  Homicidal Thoughts:No  Memory:Immediate;Good Recent;Good Remote;Good  Judgement:Fair  Insight:Fair  Psychomotor Activity:Normal  Concentration:Concentration:Fair   [place]  Akathisia:No  Handed:   AIMS (if indicated):   Assets:Communication Skills Resilience Social Support Talents/Skills  ADL's:Intact  Cognition:WNL   Sleep: 6      Treatment Plan Summary: Daily contact with patient to assess and evaluate symptoms and progress in treatment: See MAR. Md's SRA & treatment plan.  Observation Level/Precautions:  15 minute checks  Laboratory:  PER ED, UDS (+) for Benzo & THC,  Psychotherapy: Group sessions, AA/NA meetings  Medications:  See MAR  Consultations: As needed  Discharge Concerns: Safety, Mood stability, maintaining sobriety  Estimated LOS: 2-4 days  Other: admit to [address]   Physician Treatment Plan for Primary Diagnosis: MDD (major depressive disorder), recurrent episode, severe ([place])  Long Term Goal(s): Improvement in symptoms so as ready for discharge  Short Term Goals: Ability to identify changes in lifestyle to reduce recurrence of condition will improve and Ability to disclose and discuss suicidal ideas  Physician Treatment Plan for Secondary Diagnosis: Principal Problem:   MDD (major depressive disorder), recurrent episode, severe ([place])  Long Term Goal(s): Improvement in symptoms so as ready for discharge  Short Term Goals: Ability to identify and develop effective coping behaviors will improve, Compliance with prescribed medications will improve and Ability to identify triggers associated with substance abuse/mental health issues will improve  I certify that inpatient services furnished can reasonably be expected to improve the patient's condition.    Vicenta Aly, NP 12/18/20184:38 PM   I have reviewed NP's Note, assessement, diagnosis and plan, and agree. I have also met with patient and completed suicide risk assessment.   Rebecca Wilson is a 56 y/o F with history of MDD and alcohol abuse who was admitted with worsening depression, SI with plan to  overdose, and worsening symptoms of alcohol abuse. When asked about why she came to the hospital, pt shares, "It's mainly depression - I haven't been able to cope well after the last three or four months." Pt reports feeling overwhelmed, guilty, and frustrated. She endorses anhedonia, low energy, poor concentration, poor appetite (unknown if having recent weight loss), poor concentration, and psychomotor retardation. She endorses SI without specific plan, and when asked about a specific plan, she replies, "I guess I would overdose." She denies HI. She denies AH/VH. She denies symptoms of mania, OCD, and PTSD. She reports drinking about 5-6 cans of 12-ounce beers daily. She denies history of alcohol withdrawal.  Discussed with patient about treatment options. She reports that she follows up with her PCP who is a PA in [place], and recently she has been prescribed trintellix '20mg'$  qDay, xanax 0.'5mg'$  TID, trazodone '200mg'$  qhs, and latuda. She stopped taking latuda on her own about 1 month ago because it was not helpful. She has been on trilntellix for about a  year and she notes that it has never been helpful for her. She sometimes will not take the xanax, and we discussed attempting trial of a different medication for as needed treatment of anxiety. Pt reports previous trials of zoloft, paxil, wellbutrin, prozac, and seroquel - all of which were not helpful. Pt denies any specific episodes of decreased need for sleep or episodes resembling a manic episode in her history aside from when she had used illicit substances in the past and stayed up for 3 days. Discussed with patient about attempting trial of lexapro and discontinuing trintellix, and she was in agreement. We also discussed using vistaril at a higher dose for as needed treatment of anxiety and insomnia, and pt was in agreement to attempt that trial as well as lower dose of trazodone. She will be started on CIWA and monitored for signs/symptoms of alcohol  withdrawal. Pt was in agreement with the above plan, and she had no further questions, comments, or concerns. PLAN OF CARE:  -admit to inpatient level of care  -MDD recurrent, severe without psychotic features             - DC trintellix             - Start lexapro '10mg'$  qDay - Anxiety             - DC xanax             - Start vistaril '50mg'$  q6h prn anxiety/insomnia - Insomnia             - Change trazodone '200mg'$  qhs to trazodone '100mg'$  qhs             -  Start vistaril '50mg'$  q6h prn anxiety/insomnia - Alcohol withdrawal             - Start CIWA protocol with ativan  -Encourage participation in groups and the therapeutic milieu -Discharge planning will be ongoing   Maris Berger, MD

## 2017-05-16 NOTE — [facility] Suicide Risk Assessment (Signed)
[facility] INPATIENT:  Family/Significant Other Suicide Prevention Education  Suicide Prevention Education:  Education Completed: Marjory SneddonLogan Ledbetter (pt's son) [phone] has been identified by the patient as the family member/significant other with whom the patient will be residing, and identified as the person(s) who will aid the patient in the event of a mental health crisis (suicidal ideations/suicide attempt).  With written consent from the patient, the family member/significant other has been provided the following suicide prevention education, prior to the and/or following the discharge of the patient.  The suicide prevention education provided includes the following:  Suicide risk factors  Suicide prevention and interventions  National Suicide Hotline telephone number  [facility] assessment telephone number  [facility] 911  [facility] and/or Residential Mobile Crisis Unit telephone number  Request made of family/significant other to:  Remove weapons (e.g., guns, rifles, knives), all items previously/currently identified as safety concern.    Remove drugs/medications (over-the-counter, prescriptions, illicit drugs), all items previously/currently identified as a safety concern.  The family member/significant other verbalizes understanding of the suicide prevention education information provided.  The family member/significant other agrees to remove the items of safety concern listed above.  Pt's son states that pt often cycles through depressive stages and then "is fine for months." No concerns regarding pt's safety. Pt does not have access to guns/firearms. Pt's son will take her at discharge. Pt will visit with her son and daughter for a few weeks before returning home.   Kimesha Claxton N Smart LCSW 05/16/2017, 3:31 PM
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
## 2017-05-17 DIAGNOSIS — Z87891 Personal history of nicotine dependence: Secondary | ICD-10-CM

## 2017-05-17 DIAGNOSIS — F1910 Other psychoactive substance abuse, uncomplicated: Secondary | ICD-10-CM

## 2017-05-17 MED ORDER — TRAZODONE HCL 150 MG PO TABS
150.0000 mg | ORAL_TABLET | Freq: Every evening | ORAL | Status: DC | PRN
  Administered 2017-05-17: 150 mg via ORAL
  Filled 2017-05-17: qty 1

## 2017-05-17 MED ORDER — GABAPENTIN 100 MG PO CAPS
100.0000 mg | ORAL_CAPSULE | Freq: Three times a day (TID) | ORAL | Status: DC
  Administered 2017-05-17 – 2017-05-18 (×3): 100 mg via ORAL
  Filled 2017-05-17 (×8): qty 1

## 2017-05-17 NOTE — Progress Notes (Signed)
Recreation Therapy Notes  Date: 05/17/17 Time: 0930 Location: 300 Hall Dayroom  Group Topic: Stress Management  Goal Area(s) Addresses:  Patient will verbalize importance of using healthy stress management.  Patient will identify positive emotions associated with healthy stress management.   Intervention: Stress Management  Activity : Guided Imagery.  LRT introduced the stress management technique of guided imagery.  Patients were to listen and follow along as LRT read script to fully engage in the technique.  Education:  Stress Management, Discharge Planning.   Education Outcome: Acknowledges edcuation/In group clarification offered/Needs additional education  Clinical Observations/Feedback: Pt did not attend group.    Caroll RancherMarjette Rinoa Garramone, LRT/CTRS         Caroll RancherLindsay, Avary Eichenberger A 05/17/2017 12:36 PM

## 2017-05-17 NOTE — Progress Notes (Signed)
[facility]  05/17/2017 4:04 PM Rebecca Wilson  MRN:  [id number]  Subjective: Rebecca Wilson reports, "I came to the hospital because of bad depression. But today, I'm doing a lot better. The anxiety is still bad, it is a good #8. I did not get any sleep last night".  Objective: Rebecca Wilson Wilson is a 56 y/o F with history of MDD and alcohol abuse who was admitted with worsening depression, SI with plan to overdose, and worsening symptoms of alcohol abuse. When asked about why she came to the hospital, pt shares, "It's mainly depression - I haven't been able to cope well after the last three or four months." Pt reports feeling overwhelmed, guilty, and frustrated. She endorses anhedonia, low energy, poor concentration, poor appetite (unknown if having recent weight loss), poor concentration, and psychomotor retardation. Today, 05-17-17, Rebecca Wilson is seen, chart reviewed. The findings discussed with the treatment team. She presents alert, oriented x 4. Her affect is good. She is visible on the unit, participating in the group sessions. She says she did not sleep well last night. She continues to endorse high anxiety levels. She rates her depression today at #5 & anxiety #8. She denies any SIHI, AVH, delusional thoughts or paranoia. She denies any adverse effects or reactions from her medicines. She has agreed on increasing Trazodone from 100 mg to 150 mg Q hs. Patient agreed to a trial of Gabapentin 100 mg tid for agitation/anxiety.  Principal Problem: MDD (major depressive disorder), recurrent episode, severe (HCC)  Diagnosis:   Patient Active Problem List   Diagnosis Date Noted  . MDD (major depressive disorder), recurrent episode, severe (HCC) [F33.2] 05/15/2017   Total Time spent with patient: 25 minutes  Past Psychiatric History: See H&P.  Past Medical History:  Past Medical History:  Diagnosis Date  . Anxiety   . Depression    History reviewed. No pertinent surgical history.  Family History: History  reviewed. No pertinent family history.  Family Psychiatric  History: See H&P  Social History:  Social History   Substance and Sexual Activity  Alcohol Use Not on file     Social History   Substance and Sexual Activity  Drug Use Not on file    Social History   Socioeconomic History  . Marital status: Divorced    Spouse name: None  . Number of children: None  . Years of education: None  . Highest education level: None  Social Needs  . Financial resource strain: None  . Food insecurity - worry: None  . Food insecurity - inability: None  . Transportation needs - medical: None  . Transportation needs - non-medical: None  Occupational History  . None  Tobacco Use  . Smoking status: Former Games developermoker  . Smokeless tobacco: Never Used  Substance and Sexual Activity  . Alcohol use: None  . Drug use: None  . Sexual activity: None  Other Topics Concern  . None  Social History Narrative  . None   Additional Social History:   Sleep: Fair (6 hours per documentation).  Appetite:  Fair  Current Medications: Current Facility-Administered Medications  Medication Dose Route Frequency Provider Last Rate Last Dose  . acetaminophen (TYLENOL) tablet 650 mg  650 mg Oral Q6H PRN Kerry HoughSimon, Spencer E, PA-C      . alum & mag hydroxide-simeth (MAALOX/MYLANTA) 200-200-20 MG/5ML suspension 30 mL  30 mL Oral Q4H PRN Donell SievertSimon, Spencer E, PA-C      . escitalopram (LEXAPRO) tablet 10 mg  10 mg Oral  Daily Micheal Likensainville, Christopher T, MD   10 mg at 05/17/17 0809  . gabapentin (NEURONTIN) capsule 100 mg  100 mg Oral TID Armandina StammerNwoko, Jacilyn Sanpedro I, NP   100 mg at 05/17/17 1441  . hydrOXYzine (ATARAX/VISTARIL) tablet 50 mg  50 mg Oral Q6H PRN Micheal Likensainville, Christopher T, MD   50 mg at 05/17/17 1120  . ibuprofen (ADVIL,MOTRIN) tablet 600 mg  600 mg Oral Q6H PRN Kerry HoughSimon, Spencer E, PA-C   600 mg at 05/16/17 2128  . loperamide (IMODIUM) capsule 2-4 mg  2-4 mg Oral PRN Kerry HoughSimon, Spencer E, PA-C      . LORazepam (ATIVAN) tablet 1 mg  1  mg Oral Q6H PRN Donell SievertSimon, Spencer E, PA-C      . magnesium hydroxide (MILK OF MAGNESIA) suspension 30 mL  30 mL Oral Daily PRN Kerry HoughSimon, Spencer E, PA-C      . multivitamin with minerals tablet 1 tablet  1 tablet Oral Daily Kerry HoughSimon, Spencer E, PA-C   1 tablet at 05/17/17 0809  . ondansetron (ZOFRAN-ODT) disintegrating tablet 4 mg  4 mg Oral Q6H PRN Donell SievertSimon, Spencer E, PA-C      . thiamine (B-1) injection 100 mg  100 mg Intramuscular Once Donell SievertSimon, Spencer E, PA-C      . thiamine (VITAMIN B-1) tablet 100 mg  100 mg Oral Daily Donell SievertSimon, Spencer E, PA-C   100 mg at 05/17/17 0809  . traZODone (DESYREL) tablet 150 mg  150 mg Oral QHS PRN Armandina StammerNwoko, Emberley Kral I, NP       Lab Results: No results found for this or any previous visit (from the past 48 hour(s)).  Blood Alcohol level:  Lab Results  Component Value Date   ETH <10 05/15/2017   Metabolic Disorder Labs: No results found for: HGBA1C, MPG No results found for: PROLACTIN No results found for: CHOL, TRIG, HDL, CHOLHDL, VLDL, LDLCALC  Physical Findings: AIMS: Facial and Oral Movements Muscles of Facial Expression: None, normal Lips and Perioral Area: None, normal Jaw: None, normal Tongue: None, normal,Extremity Movements Upper (arms, wrists, hands, fingers): None, normal Lower (legs, knees, ankles, toes): None, normal, Trunk Movements Neck, shoulders, hips: None, normal, Overall Severity Severity of abnormal movements (highest score from questions above): None, normal Incapacitation due to abnormal movements: None, normal Patient's awareness of abnormal movements (rate only patient's report): No Awareness, Dental Status Current problems with teeth and/or dentures?: No Does patient usually wear dentures?: No  CIWA:  CIWA-Ar Total: 1 COWS:  COWS Total Score: 1  Musculoskeletal: Strength & Muscle Tone: within normal limits Gait & Station: normal Patient leans: N/A  Psychiatric Specialty Exam: Physical Exam  Nursing note and vitals reviewed.   Review  of Systems  Psychiatric/Behavioral: Positive for depression ("Improving") and substance abuse (Hx. Benzodiazepine use). Negative for hallucinations, memory loss and suicidal ideas. The patient is nervous/anxious and has insomnia.     Blood pressure (!) 97/56, pulse 60, temperature 98.8 F (37.1 C), temperature source Oral, resp. rate 18, height 5' 4.5" (1.638 m), weight 66.2 kg (146 lb), SpO2 100 %.Body mass index is 24.67 kg/m.  General Appearance: Casual and Fairly Groomed  Eye Contact:  Good  Speech:  Clear and Coherent and Normal Rate  Volume:  Normal  Mood:  Anxious and Depressed  Affect:  Appropriate, Congruent, Constricted and Tearful  Thought Process:  Coherent and Goal Directed  Orientation:  Full (Time, Place, and Person)  Thought Content:  Logical  Suicidal Thoughts:  Yes.  with intent/plan  Homicidal Thoughts:  No  Memory:  Immediate;   Good Recent;   Good Remote;   Good  Judgement:  Fair  Insight:  Fair  Psychomotor Activity:  Normal  Concentration:  Concentration: Fair  Recall:  Fair  Fund of Knowledge:  Good  Language:  Fair  Akathisia:  No  Handed:    AIMS (if indicated):     Assets:  Communication Skills Resilience Social Support Talents/Skills  ADL's:  Intact  Cognition:  WNL  Sleep:  Number of Hours: 6     Treatment Plan Summary: Daily contact with patient to assess and evaluate symptoms and progress in treatment: Patient is approaching her baseline. No evidence of psychosis. No evidence of mania. No dangerousness. We are finalizing aftercare.  Depression.    - Continue Lexapro 10 mg po daily.  Anxiety.    - Continue Hydroxyzine 50 mg po prn Qid.    - Initiated Gabapentin 100 mg po tid.  Substance withdrawal symptoms.     - Continue Lorazepam 1 mg po qid prn per CIWA > 10.  Insomnia.    - Continue Trazodone 50 mg po Q hs prn.     - Continue all medicationsregimen for the other medical complaints as recommended.     - Patient to continue  to participate in the group milieu.    - SW to continue to work on discharge disposition.  Armandina Stammer, NP, PMHNP, FNp-BC. 05/17/2017, 4:04 PM

## 2017-05-17 NOTE — Tx Team (Signed)
Interdisciplinary Treatment and Diagnostic Plan Update  05/17/2017 Time of Session: 0830AM Rebecca Wilson MRN: [id number]  Principal Diagnosis: MDD (major depressive disorder), recurrent episode, severe (HCC)  Secondary Diagnoses: Principal Problem:   MDD (major depressive disorder), recurrent episode, severe (HCC)   Current Medications:  Current Facility-Administered Medications  Medication Dose Route Frequency Provider Last Rate Last Dose  . acetaminophen (TYLENOL) tablet 650 mg  650 mg Oral Q6H PRN Kerry Hough, PA-C      . alum & mag hydroxide-simeth (MAALOX/MYLANTA) 200-200-20 MG/5ML suspension 30 mL  30 mL Oral Q4H PRN Donell Sievert E, PA-C      . escitalopram (LEXAPRO) tablet 10 mg  10 mg Oral Daily Micheal Likens, MD   10 mg at 05/17/17 0809  . hydrOXYzine (ATARAX/VISTARIL) tablet 50 mg  50 mg Oral Q6H PRN Micheal Likens, MD   50 mg at 05/17/17 0128  . ibuprofen (ADVIL,MOTRIN) tablet 600 mg  600 mg Oral Q6H PRN Kerry Hough, PA-C   600 mg at 05/16/17 2128  . loperamide (IMODIUM) capsule 2-4 mg  2-4 mg Oral PRN Kerry Hough, PA-C      . LORazepam (ATIVAN) tablet 1 mg  1 mg Oral Q6H PRN Donell Sievert E, PA-C      . magnesium hydroxide (MILK OF MAGNESIA) suspension 30 mL  30 mL Oral Daily PRN Kerry Hough, PA-C      . multivitamin with minerals tablet 1 tablet  1 tablet Oral Daily Kerry Hough, PA-C   1 tablet at 05/17/17 0809  . ondansetron (ZOFRAN-ODT) disintegrating tablet 4 mg  4 mg Oral Q6H PRN Donell Sievert E, PA-C      . thiamine (B-1) injection 100 mg  100 mg Intramuscular Once Donell Sievert E, PA-C      . thiamine (VITAMIN B-1) tablet 100 mg  100 mg Oral Daily Donell Sievert E, PA-C   100 mg at 05/17/17 0809  . traZODone (DESYREL) tablet 100 mg  100 mg Oral QHS PRN Micheal Likens, MD   100 mg at 05/16/17 2110   PTA Medications: Medications Prior to Admission  Medication Sig Dispense Refill Last Dose  . albuterol  (PROVENTIL HFA;VENTOLIN HFA) 108 (90 Base) MCG/ACT inhaler Inhale 2 puffs into the lungs every 6 (six) hours as needed for wheezing or shortness of breath.   unknown  . atorvastatin (LIPITOR) 20 MG tablet Take 20 mg by mouth daily.   05/14/2017 at Unknown time  . fluticasone furoate-vilanterol (BREO ELLIPTA) 100-25 MCG/INH AEPB Inhale 1 puff into the lungs daily.   unknown  . ibuprofen (ADVIL,MOTRIN) 200 MG tablet Take 200-400 mg by mouth every 6 (six) hours as needed for moderate pain.   05/15/2017 at Unknown time  . meloxicam (MOBIC) 7.5 MG tablet Take 7.5 mg by mouth 2 (two) times daily.   05/14/2017 at Unknown time  . terbinafine (LAMISIL) 250 MG tablet Take 250 mg by mouth daily.   05/14/2017 at Unknown time  . traZODone (DESYREL) 100 MG tablet Take 200 mg by mouth at bedtime.   05/14/2017 at Unknown time  . vortioxetine HBr (TRINTELLIX) 10 MG TABS Take 1 tablet by mouth 2 (two) times daily.   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   Physician Treatment Plan for Primary Diagnosis: MDD (major depressive disorder), recurrent episode, severe (HCC) Long Term Goal(s): Improvement in symptoms so as ready for discharge Improvement in symptoms so as ready for discharge   Short Term Goals: Ability to identify changes in lifestyle to reduce recurrence of condition will improve Ability to disclose and discuss suicidal ideas Ability to identify and develop effective coping behaviors will improve Compliance with prescribed medications will improve Ability to identify triggers associated with substance abuse/mental health issues will improve  Medication Management:  Evaluate patient's response, side effects, and tolerance of medication regimen.  Therapeutic Interventions: 1 to 1 sessions, Unit Group sessions and Medication administration.  Evaluation of Outcomes: Progressing  Physician Treatment Plan for Secondary Diagnosis: Principal Problem:   MDD (major depressive disorder), recurrent episode, severe (HCC)  Long Term Goal(s): Improvement in symptoms so as ready for discharge Improvement in symptoms so as ready for discharge   Short Term Goals: Ability to identify changes in lifestyle to reduce recurrence of condition will improve Ability to disclose and discuss suicidal ideas Ability to identify and develop effective coping behaviors will improve Compliance with prescribed medications will improve Ability to identify triggers associated with substance abuse/mental health issues will improve     Medication Management: Evaluate patient's response, side effects, and tolerance of medication regimen.  Therapeutic Interventions: 1 to 1 sessions, Unit Group sessions and Medication administration.  Evaluation of Outcomes: Progressing   RN Treatment Plan for Primary Diagnosis: MDD (major depressive disorder), recurrent episode, severe (HCC) Long Term Goal(s): Knowledge of disease and therapeutic regimen to maintain health will improve  Short Term Goals: Ability to remain free from injury will improve, Ability to disclose and discuss suicidal ideas and Ability to identify and develop effective coping behaviors will improve  Medication Management: RN will administer medications as ordered by provider, will assess and evaluate patient's response and provide education to patient for prescribed medication. RN will report any adverse and/or side effects to prescribing provider.  Therapeutic Interventions: 1 on 1 counseling sessions, Psychoeducation, Medication administration, Evaluate responses to treatment, Monitor vital signs and CBGs as ordered,  Perform/monitor CIWA, COWS, AIMS and Fall Risk screenings as ordered, Perform wound care treatments as ordered.  Evaluation of Outcomes: Progressing   LCSW Treatment Plan for Primary Diagnosis: MDD (major depressive disorder), recurrent episode, severe (HCC) Long Term Goal(s): Safe transition to appropriate next level of care at discharge, Engage patient in therapeutic group addressing interpersonal concerns.  Short Term Goals: Engage patient in aftercare planning with referrals and resources, Facilitate patient progression through stages of change regarding substance use diagnoses and concerns and Identify triggers associated with mental health/substance abuse issues  Therapeutic Interventions: Assess for all discharge needs, 1 to 1 time with Social worker, Explore available resources and support systems, Assess for adequacy in community support network, Educate family and significant other(s) on suicide prevention, Complete Psychosocial Assessment, Interpersonal group therapy.  Evaluation of Outcomes: Progressing   Progress in Treatment: Attending groups: Yes. Participating in groups: Yes. Taking medication as prescribed: Yes. Toleration medication: Yes. Family/Significant other contact made: Yes, individual(s) contacted:  pt's son for collateral information and to complete SPE. Patient understands diagnosis: Yes. Discussing patient identified problems/goals with staff: Yes. Medical problems stabilized or resolved: Yes. Denies suicidal/homicidal ideation: Yes. Issues/concerns per patient self-inventory: No. Other: n/a  New problem(s) identified: No, Describe:  n/a  New Short Term/Long Term Goal(s): alcohol detox, medication management for mood stabilization, elimination of SI thoughts,  development of comprehensive mental wellness/sobriety plan.   Discharge Plan or Barriers: CSW assessing. Pt refusing residential, SAIOP, and therapy/psychiatry referrals. She is adament that she wants  to [facility]. Appt made. Pt [place] [facility] [place] and [facility] pamph[facility] for additional community support being that [place] children/grandchildren.  Reason for Continuation of Hospitalization: Anxiety Depression Medication stabilization Suicidal ideation Withdrawal symptoms  Estimated Length of Stay: Friday, 05/19/17  Attendees: Patient: 05/17/2017 9:05 AM  Physician: Dr. Altamese Carolinaainville MD; Dr. Jackquline BerlinIzediuno MD 05/17/2017 9:05 AM  Nursing: Alphonzo LemmingsPatrice, jane RN 05/17/2017 9:05 AM  RN Care Manager:x 05/17/2017 9:05 AM  Social Worker: Chartered loss adjusterHeather Smart, LCSW 05/17/2017 9:05 AM  Recreational Therapist: x 05/17/2017 9:05 AM  Other: Armandina StammerAgnes Nwoko NP; Feliz Beamravis Money NP 05/17/2017 9:05 AM  Other:  05/17/2017 9:05 AM  Other: 05/17/2017 9:05 AM    Scribe for Treatment Team: Ledell PeoplesHeather N Smart, LCSW 05/17/2017 9:05 AM

## 2017-05-17 NOTE — Progress Notes (Signed)
D    Pt is irritable and anxious    She said she wasn't withdrawing from any substance and did not need anything for withdrawal   She interacts with select peers and is visible on the milieu A   Verbal support given   Medications administered and effectiveness monitored   Q 15 min checks R   Pt is safe at present time

## 2017-05-17 NOTE — [facility] Group Notes (Signed)
Pt attended and participated in wrap up group and rated their day a 5/10. Pt goal was to talk with the Dr, and was able to get some good feed back. Pt did not sleep all day and that was a positive for them.

## 2017-05-17 NOTE — Progress Notes (Signed)
DAR: Pt presents with a flat affect and a depressed mood. Pt rates depression 8/10. Hopelessness 8/10. Anxiety 10/10. Pt reports poor sleep last night due to racing thoughts. Pt denies any withdrawal symptoms. Pt verbalized that she would like to have her medications adjusted. Pt stated that her sleep med and antianxiety med are not effective. Treatment team made aware of pt complaints. Medications reviewed with pt. Medications administered as ordered per MD. Verbal support provided. Pt encouraged to attend groups. 15 minute checks performed for safety.  Pt compliant with tx plan.

## 2017-05-18 MED ORDER — TRAZODONE HCL 100 MG PO TABS: 200.0000 mg | ORAL_TABLET | Freq: Every evening | ORAL | 0 refills | Status: DC | PRN

## 2017-05-18 MED ORDER — HYDROXYZINE HCL 50 MG PO TABS: ORAL_TABLET | ORAL | 0 refills | Status: DC

## 2017-05-18 MED ORDER — IBUPROFEN 200 MG PO TABS: 200.0000 mg | ORAL_TABLET | Freq: Four times a day (QID) | ORAL | 0 refills | Status: DC | PRN

## 2017-05-18 MED ORDER — TRAZODONE HCL 100 MG PO TABS: 200.0000 mg | ORAL_TABLET | Freq: Every evening | ORAL | Status: DC | PRN

## 2017-05-18 MED ORDER — GABAPENTIN 100 MG PO CAPS: 100.0000 mg | ORAL_CAPSULE | Freq: Three times a day (TID) | ORAL | 0 refills | Status: DC

## 2017-05-18 MED ORDER — ESCITALOPRAM OXALATE 10 MG PO TABS: 10.0000 mg | ORAL_TABLET | Freq: Every day | ORAL | 0 refills | Status: DC

## 2017-05-18 NOTE — [facility] Suicide Risk Assessment (Signed)
[facility]   Principal Problem: MDD (major depressive disorder), recurrent episode, severe (HCC) Discharge Diagnoses:  Patient Active Problem List   Diagnosis Date Noted  . MDD (major depressive disorder), recurrent episode, severe (HCC) [F33.2] 05/15/2017    Total Time spent with patient: 30 minutes  Musculoskeletal: Strength & Muscle Tone: within normal limits Gait & Station: normal Patient leans: N/A  Psychiatric Specialty Exam: Review of Systems  Constitutional: Negative for chills and fever.  Respiratory: Negative for cough and shortness of breath.   Gastrointestinal: Negative for abdominal pain, heartburn, nausea and vomiting.  Psychiatric/Behavioral: Negative for depression, hallucinations and suicidal ideas. The patient is not nervous/anxious.     Blood pressure 94/63, pulse 92, temperature 98.2 F (36.8 C), resp. rate 18, height 5' 4.5" (1.638 m), weight 66.2 kg (146 lb), SpO2 100 %.Body mass index is 24.67 kg/m.  General Appearance: Casual and Fairly Groomed  Patent attorneyye Contact::  Good  Speech:  Clear and Coherent and Normal Rate  Volume:  Normal  Mood:  Euthymic  Affect:  Appropriate and Congruent  Thought Process:  Coherent and Goal Directed  Orientation:  Full (Time, Place, and Person)  Thought Content:  Logical  Suicidal Thoughts:  No  Homicidal Thoughts:  No  Memory:  Immediate;   Good Recent;   Good Remote;   Good  Judgement:  Fair  Insight:  Fair  Psychomotor Activity:  Normal  Concentration:  Fair  Recall:  FiservFair  Fund of Knowledge:Fair  Language: Fair  Akathisia:  No  Handed:    AIMS (if indicated):     Assets:  Manufacturing systems engineerCommunication Skills Physical Health Resilience Social Support  Sleep:  Number of Hours: 6.75  Cognition: WNL  ADL's:  Intact   Mental Status Per Nursing Assessment::   On Admission:  Self-harm thoughts  Demographic Factors:  Caucasian  Loss Factors: NA  Historical Factors: Impulsivity  Risk Reduction  Factors:   Positive social support, Positive therapeutic relationship and Positive coping skills or problem solving skills  Continued Clinical Symptoms:  Depression:   Comorbid alcohol abuse/dependence  Cognitive Features That Contribute To Risk:  None    Suicide Risk:  Minimal: No identifiable suicidal ideation.  Patients presenting with no risk factors but with morbid ruminations; may be classified as minimal risk based on the severity of the depressive symptoms  Follow-up Information    Collene LeydenLisa Wilson [facility] up on 06/06/2017.   Why:  Hospital follow-up on Tuesday, 1/8 at 1:30PM with Cheral MarkerLisa Yokum. Please call Rebecca Wilson at the office if you have to reschedule. Thank you.  Contact information: [address] [place], [place] [address] Phone: [phone] Fax: [phone]       Patient declined additional referrals. Follow up.         Subjective Data: Rebecca SalvoKay Coyle is a 56 y/o F with history of MDD and alcohol abuse who was admitted with worsening depression, SI with plan to overdose, and worsening symptoms of alcohol abuse. She was discontinued from trintellix as it was not helping and started on lexapro 10mg  qDay. She also was discontinued from xanax during her hospitalization, and started on vistaril 50mg  q6h. She was placed on CIWA protocol and trazodone home dose was initially decreased due to concern of some daytime sedation, but then pt reported poor sleep and dose was increased.  Today upon evaluation, pt reports that she is doing well overall. She denies SI/HI/AH/VH. She reports that she is feeling physically well and she has no  signs/symptoms of withdrawal. She notes some mild difficulty with getting to sleep last night, and she requests to increase trazodone to previous home dose of trazodone 200mg  qhs. Pt is able to engage in safety planning including [facility] or contact emergency services if she feels unable to maintain her own safety. She had no  further questions, comments, or concerns.    Plan Of Care/Follow-up recommendations:   -Discharge to outpatient level of care  -MDD recurrent, severe without psychotic features             - Continue lexapro 10mg  qDay - Anxiety             - Continue vistaril 50mg  q6h prn anxiety/insomnia - Insomnia             - Change trazodone 150mg  qhs to trazodone 200mg  qhs               Activity:  as tolerated Diet:  normal Tests:  NA Other:  See above for DC plan  [name], MD 05/18/2017, 10:01 AM

## 2017-05-18 NOTE — [facility] Group Notes (Signed)
The focus of this group is to educate the patient on the purpose and policies of crisis stabilization and provide a format to answer questions about their admission.  The group details unit policies and expectations of patients while admitted.  Patient attended 0900 nurse education orientation program this morning.  Patient actively participated and had appropriate affect.  Patient is alert.  Patient had appropriate insight and appropriate engagement.  Today patient will work on 3 goals for discharge.         

## 2017-05-18 NOTE — Discharge Summary (Signed)
Physician Discharge Summary Note  Patient:  Rebecca Wilson is an 56 y.o., female MRN:  [id number] DOB:  [birth year] Patient phone:  [phone] (home)  Patient address:   [place] [address],  Total Time spent with patient: Greater than 30 minutes  Date of Admission:  05/15/2017  Date of Discharge: 05-18-17.  Reason for Admission: Worsening symptoms of depression triggering suicidal with plans to overdose on medications.  Principal Problem: MDD (major depressive disorder), recurrent episode, severe [facility])  Discharge Diagnoses: Patient Active Problem List   Diagnosis Date Noted  . MDD (major depressive disorder), recurrent episode, severe ([place]) [F33.2] 05/15/2017   Past Psychiatric History: Mdd  Past Medical History:  Past Medical History:  Diagnosis Date  . Anxiety   . Depression    History reviewed. No pertinent surgical history.  Family History: History reviewed. No pertinent family history.  Family Psychiatric  History: See H&P  Social History:  Social History   Substance and Sexual Activity  Alcohol Use Not on file     Social History   Substance and Sexual Activity  Drug Use Not on file    Social History   Socioeconomic History  . Marital status: Divorced    Spouse name: None  . Number of children: None  . Years of education: None  . Highest education level: None  Social Needs  . Financial resource strain: None  . Food insecurity - worry: None  . Food insecurity - inability: None  . Transportation needs - medical: None  . Transportation needs - non-medical: None  Occupational History  . None  Tobacco Use  . Smoking status: Former Research scientist (life sciences)  . Smokeless tobacco: Never Used  Substance and Sexual Activity  . Alcohol use: None  . Drug use: None  . Sexual activity: None  Other Topics Concern  . None  Social History Narrative  . None   Hospital Course: Rebecca Wilson is a 56 y/o F with history of MDD and alcohol abuse who was  admitted with worsening depression, SI with plan to overdose, and worsening symptoms of alcohol abuse.   Patient reported being overwhelmed about life in general. She stated, "I feel like I cant cope any longer". She reported her biggest stressor is her 67 y.o step son who is making life miserable for her. She reports step son is done with high school and does not want to go to college. She reports he stays home and brings all kinds of friends to the house, he does not clean the house and he is very disrespectful to her. She said her husband does not to anything regarding step son's behavior. Patient stated she has now moved out of the house and moved in with his son in [place] where she feels safe.  After her admission assessment/evaluation, Rebecca Wilson was started on the medication regimen for her depressive symptoms. She was medicated & discharged on Hydroxyzine 50 mg prn for anxiety, Lexapro 10 mg for depression, Gabapentin 100 mg for agitation & trazodone 200 mg for insomnia. She was also enrolled in the group counseling sessions/activities being offered & held on this unit to help her learn coping skills that will aid her achieve and maintain mood stability after discharge. Rebecca Wilson attended and participated in these activities as recommended.  She also received other medication regimen & monitoring for her other medical complaints. She tolerated her treatment regimen without any significant adverse effects and or reactions reported. Patient did respond adequately to her treatment plan. This  is evidenced by her reports of improved mood, presentation of good affect and reports of symptom reduction.  She met with her attending psychiatrist this morning. Her treatment plan, reasons for admission and response to treatment regimen discussed. Rebecca Wilson endorsed that she is doing well and ready to be discharged to the home with son. It was agreed upon that she will continue psychiatric care on an outpatient basis as noted  below. She left [facility] with all personal belongings in no apparent distress. Transportation per son.  Physical Findings:  AIMS: Facial and Oral Movements Muscles of Facial Expression: None, normal Lips and Perioral Area: None, normal Jaw: None, normal Tongue: None, normal,Extremity Movements Upper (arms, wrists, hands, fingers): None, normal Lower (legs, knees, ankles, toes): None, normal, Trunk Movements Neck, shoulders, hips: None, normal, Overall Severity Severity of abnormal movements (highest score from questions above): None, normal Incapacitation due to abnormal movements: None, normal Patient's awareness of abnormal movements (rate only patient's report): No Awareness, Dental Status Current problems with teeth and/or dentures?: No Does patient usually wear dentures?: No  CIWA:  CIWA-Ar Total: 0 COWS:  COWS Total Score: 1  Musculoskeletal: Strength & Muscle Tone: within normal limits Gait & Station: normal Patient leans: N/A  Psychiatric Specialty Exam: Physical Exam  Constitutional: She appears well-developed.  HENT:  Head: Normocephalic.  Eyes: Pupils are equal, round, and reactive to light.  Neck: Normal range of motion.  Cardiovascular: Normal rate.  Respiratory: Effort normal.  GI: Soft.  Genitourinary:  Genitourinary Comments: Deferred  Musculoskeletal: Normal range of motion.  Neurological: She is alert.  Skin: Skin is warm.    Review of Systems  Constitutional: Negative.   HENT: Negative.   Eyes: Negative.   Respiratory: Negative.   Cardiovascular: Negative.   Gastrointestinal: Negative.   Genitourinary: Negative.   Musculoskeletal: Negative.   Skin: Negative.   Neurological: Negative.   Endo/Heme/Allergies: Negative.   Psychiatric/Behavioral: Positive for depression ([place]). Negative for suicidal ideas.    Blood pressure 94/63, pulse 92, temperature 98.2 F (36.8 C), resp. rate 18, height 5' 4.5" (1.638 m), weight 66.2 kg (146 lb), SpO2 100 %.Body  mass index is 24.67 kg/m.  See Md's SRA   Have you used any form of tobacco in the last 30 days? (Cigarettes, Smokeless Tobacco, Cigars, and/or Pipes): No  Has this patient used any form of tobacco in the last 30 days? (Cigarettes, Smokeless Tobacco, Cigars, and/or Pipes): N/A  Blood Alcohol level:  Lab Results  Component Value Date   ETH <10 58/52/7782   Metabolic Disorder Labs:  No results found for: HGBA1C, MPG No results found for: PROLACTIN No results found for: CHOL, TRIG, HDL, CHOLHDL, VLDL, LDLCALC  See Psychiatric Specialty Exam and Suicide Risk Assessment completed by Attending Physician prior to discharge.  Discharge destination:  Home  Is patient on multiple antipsychotic therapies at discharge:  No   Has Patient had three or more failed trials of antipsychotic monotherapy by history:  No  Recommended Plan for Multiple Antipsychotic Therapies: NA  Allergies as of 05/18/2017   No Known Allergies     Medication List    STOP taking these medications   albuterol 108 (90 Base) MCG/ACT inhaler Commonly known as:  PROVENTIL HFA;VENTOLIN HFA   atorvastatin 20 MG tablet Commonly known as:  LIPITOR   BREO ELLIPTA 100-25 MCG/INH Aepb Generic drug:  fluticasone furoate-vilanterol   meloxicam 7.5 MG tablet Commonly known as:  MOBIC   terbinafine 250 MG tablet Commonly known as:  LAMISIL  TRINTELLIX 10 MG Tabs Generic drug:  vortioxetine HBr     TAKE these medications     Indication  escitalopram 10 MG tablet Commonly known as:  LEXAPRO Take 1 tablet (10 mg total) by mouth daily. For depression  Indication:  Major Depressive Disorder   gabapentin 100 MG capsule Commonly known as:  NEURONTIN Take 1 capsule (100 mg total) by mouth 3 (three) times daily. For agiation  Indication:  Agitation   hydrOXYzine 50 MG tablet Commonly known as:  ATARAX/VISTARIL Take 1 tablet (50 mg) by mouth every 6 hours as needed: For anxiety  Indication:  Feeling Anxious    ibuprofen 200 MG tablet Commonly known as:  ADVIL,MOTRIN Take 1-2 tablets (200-400 mg total) by mouth every 6 (six) hours as needed for moderate pain.  Indication:  Mild to Moderate Pain   traZODone 100 MG tablet Commonly known as:  DESYREL Take 2 tablets (200 mg total) by mouth at bedtime as needed for sleep. What changed:    when to take this  reasons to take this  Indication:  [facility] Follow up on 06/06/2017.   Why:  Hospital follow-up on Tuesday, 1/8 at 1:30PM with Aleen Campi. Please call [name] at the office if you have to reschedule. Thank you.  Contact information: [address] [place], [place] [address] Phone: [phone] Fax: [phone]       Patient declined additional referrals. Follow up.          Follow-up recommendations: Activity:  As tolerated Diet: As recommended by your primary care doctor. Keep all scheduled follow-up appointments as recommended.  Comments: Patient is instructed prior to discharge to: Take all medications as prescribed by his/her mental healthcare provider. Report any adverse effects and or reactions from the medicines to his/her outpatient provider promptly. Patient has been instructed & cautioned: To not engage in alcohol and or illegal drug use while on prescription medicines. In the event of worsening symptoms, patient is instructed to call the crisis hotline, 911 and or go to the nearest ED for appropriate evaluation and treatment of symptoms. To follow-up with his/her primary care provider for your other medical issues, concerns and or health care needs.   Signed: Lindell Spar, NP, PMHNP, FNP-BC. 05/19/2017, 8:58 AM   Patient seen, Suicide Assessment Completed.  Disposition Plan Reviewed   Keaghan Bowens is a 56 y/o F with history of MDD and alcohol abuse who was admitted with worsening depression, SI with plan to overdose, and worsening symptoms of  alcohol abuse. She was discontinued from trintellix as it was not helping and started on lexapro 24m qDay. She also was discontinued from xanax during her hospitalization, and started on vistaril 517mq6h. She was placed on CIWA protocol and trazodone home dose was initially decreased due to concern of some daytime sedation, but then pt reported poor sleep and dose was increased.  Today upon evaluation, pt reports that she is doing well overall. She denies SI/HI/AH/VH. She reports that she is feeling physically well and she has no signs/symptoms of withdrawal. She notes some mild difficulty with getting to sleep last night, and she requests to increase trazodone to previous home dose of trazodone 20084mhs. Pt is able to engage in safety planning including plan to [facility] contact emergency services if she feels unable to maintain her own safety. She had no further questions, comments, or concerns.  Plan Of Care/Follow-up recommendations:   -Discharge to outpatient level of care  -MDD recurrent, severe without psychotic features - Continue lexapro 61m qDay - Anxiety - Continue vistaril 527mq6h prn anxiety/insomnia - Insomnia - Change trazodone 15028mhs to trazodone 200m61ms   Activity:  as tolerated Diet:  normal Tests:  NA Other:  See above for DC plan  ChriPennelope Bracken

## 2017-05-18 NOTE — Progress Notes (Signed)
  [facility] Adult Case Management Discharge Plan :  Will you be returning to the same living situation after discharge:  Yes,  home with son for the holidays. Will return to her home with husband after the New Year. At discharge, do you have transportation home?: Yes,  son Do you have the ability to pay for your medications: Yes,  medicare  Release of information consent forms completed and submitted to medical records by CSW.  Patient to Follow up at: Follow-up Information    [facility].   Why:  Hospital follow-up on Tuesday, 1/8 at 1:30PM with Cheral MarkerLisa Yokum. Please call [name] at the office if you have to reschedule. Thank you.  Contact information: [address] [place], [place] [address] Phone: [phone] Fax: [phone]       Patient declined additional referrals. Follow up.           Next level of care provider has access to [facility] Link:no  Safety Planning and Suicide Prevention discussed: Yes,  SPE completed with pt's son; SPI pamphlet and Mobile Crisis information provided to pt.   Have you used any form of tobacco in the last 30 days? (Cigarettes, Smokeless Tobacco, Cigars, and/or Pipes): No  Has patient been referred to the Quitline?: N/A patient is not a smoker  Patient has been referred for addiction treatment: Yes  Pulte HomesHeather N Smart, LCSW 05/18/2017, 9:14 AM

## 2017-05-18 NOTE — Progress Notes (Signed)
Discharge note: Pt received both written and verbal discharge instructions. Pt verbalized understanding of discharge instructions. Pt agreed to f/u appt and med regimen. Pt received SRA, AVS, transitional record and prescriptions. Pt gathered belongings from room and locker. Pt safely discharged to the lobby.  

## 2020-08-19 ENCOUNTER — Ambulatory Visit: Payer: Self-pay | Admitting: Orthopedic Surgery

## 2020-08-25 ENCOUNTER — Other Ambulatory Visit (HOSPITAL_COMMUNITY)
Admission: RE | Admit: 2020-08-25 | Discharge: 2020-08-25 | Disposition: A | Discharge status: Home / Self Care | Payer: Medicare Other | Source: Ambulatory Visit | Attending: Orthopedic Surgery | Admitting: Orthopedic Surgery

## 2020-08-25 DIAGNOSIS — Z20822 Contact with and (suspected) exposure to covid-19: Secondary | ICD-10-CM | POA: Diagnosis not present

## 2020-08-25 DIAGNOSIS — Z01812 Encounter for preprocedural laboratory examination: Secondary | ICD-10-CM | POA: Insufficient documentation

## 2020-08-25 LAB — SARS CORONAVIRUS 2 (TAT 6-24 HRS): SARS Coronavirus 2: NEGATIVE

## 2020-08-26 ENCOUNTER — Other Ambulatory Visit: Payer: Self-pay

## 2020-08-26 ENCOUNTER — Ambulatory Visit: Payer: Self-pay | Admitting: Orthopedic Surgery

## 2020-08-26 ENCOUNTER — Encounter (HOSPITAL_COMMUNITY): Payer: Self-pay | Admitting: Orthopedic Surgery

## 2020-08-26 NOTE — H&P (Signed)
Subjective:   For location, patient reports right. For quality, she reports aching, stabbing, sharp, constant, and worsening. For severity, she reports pain level 10/10 and worst pain 10+/10. For duration, she reports 1 months. For timing, she reports acute. For context, she reports fall. For aggravating factors, she reports standing, walking, and bending/squatting. For associated symptoms, she reports no weakness, no numbness, no tingling, and no radiation down leg.  Patient Active Problem List   Diagnosis Date Noted  . MDD (major depressive disorder), recurrent episode, severe ([facility]) 05/15/2017   Past Medical History:  Diagnosis Date  . Anxiety   . Depression     No past surgical history on file.  Current Outpatient Medications  Medication Sig Dispense Refill Last Dose  . ALPRAZolam (XANAX) 0.5 MG tablet Take 0.5 mg by mouth 3 (three) times daily as needed for anxiety.     Ailene Ards ELLIPTA 62.5-25 MCG/INH AEPB Inhale 1 puff into the lungs daily as needed (respiratory issues).     Marland Kitchen augmented betamethasone dipropionate (DIPROLENE-AF) 0.05 % cream Apply 1 application topically 2 (two) times daily as needed (psoriasis).     . clobetasol (TEMOVATE) 0.05 % external solution Apply 1 application topically daily as needed (psoriasis).     . clotrimazole-betamethasone (LOTRISONE) cream Apply 1 application topically 2 (two) times daily as needed (corners of mouth (cracking)).     . fluticasone (CUTIVATE) 0.05 % cream Apply 1 application topically 2 (two) times daily as needed (psoriasis).     Marland Kitchen HYDROcodone-acetaminophen (NORCO/VICODIN) 5-325 MG tablet Take 1 tablet by mouth every 4 (four) hours as needed (pain).     . meloxicam (MOBIC) 7.5 MG tablet Take 7.5 mg by mouth in the morning and at bedtime.     . methocarbamol (ROBAXIN) 500 MG tablet Take 500 mg by mouth every 6 (six) hours as needed for muscle spasms.     . methylPREDNISolone (MEDROL DOSEPAK) 4 MG TBPK tablet Take 4-24 mg by mouth as  directed. Take these numbers of tablets on consecutive days:6-5-4-3-2-1     . mirtazapine (REMERON) 15 MG tablet Take 15 mg by mouth at bedtime.     . ondansetron (ZOFRAN) 4 MG tablet Take 4 mg by mouth in the morning and at bedtime.     . traZODone (DESYREL) 100 MG tablet Take 200 mg by mouth at bedtime.     . vortioxetine HBr (TRINTELLIX) 10 MG TABS tablet Take 10 mg by mouth in the morning and at bedtime.      No current facility-administered medications for this visit.   No Known Allergies  Social History   Tobacco Use  . Smoking status: Former Games developer  . Smokeless tobacco: Never Used  Substance Use Topics  . Alcohol use: Yes    Comment: beer 6-8/night    No family history on file.  Review of Systems Pertinent items are noted in HPI.  Objective:   Clinical exam: Erika is a pleasant individual, who appears younger than their stated age. She is alert and orientated 3. No shortness of breath, chest pain. Abdomen is soft and non-tender, negative loss of bowel and bladder control, no rebound tenderness. Negative: skin lesions abrasions contusions    Lungs: Clear to auscultation bilaterally    Cardiac: Regular rate and rhythm. No rubs gallops murmurs.    Peripheral pulses: 2+ dorsalis pedis/posterior tibialis pulses bilaterally. Compartment soft and nontender.    Gait pattern: Altered gait pattern due to severe lower thoracic pain.    Assistive devices: None  Neuro: 5/5 motor strength in the lower extremity bilaterally. Negative nerve root tension signs in the lower extremity bilaterally. No clonus, negative Babinski test. Symmetrical 2+ deep tendon reflexes at the knee and Achilles.    Musculoskeletal: Exquisite severe 10 pain in the lower thoracic spine with direct palpation. Pain radiates into the paraspinal region. No pain in the cervical or lumbar spine. No pain radiating around the rib cage.    Thoracic x-rays demonstrate a T12 compression fracture with significant loss of  anterior height. It is not a complete vertebral plana.    Thoracic MRI: completed on 08/07/2008 was reviewed with the patient. It was completed at [facility]; I have independently reviewed the images as well as the radiology report. Acute T12 vertebral body fracture with 50% loss of height centrally and anteriorly. There is some retropulsion but no cord compression or significant stenosis. There is no cord signal changes there is some thickening of the ligamentum flavum at T10-11 and a small left posterior disc protrusion at T6-7.     Assessment:   Diagnosis: Ellana is a very pleasant 60 year old woman with a 3 week history of severe thoracic pain. Imaging studies demonstrate a compression fracture of T12 and I do believe this is her primary pain source. She was prescribed a TLSO brace but did not pick it up due to financial reasons. At this point time we have talked about surgical intervention which be an attempted kyphoplasty. I did tell her that with a significant central compression, I may not be able to technically move forward with a kyphoplasty. I did tell her that we could try but if to at the time of surgery there is further collapse or because of the central collapse I could not advance the balloon far enough then we would need to abort the case.   Plan:   Risks of surgery include: Infection, bleeding, death, stroke, paralysis, nerve damage, leak of cement, need for additional surgery including open decompression. Ongoing or worse pain.    Goals of surgery: Reduction in pain, and improvement in quality of life    Treatment plan: Patient has expressed a desire and willingness to move forward with surgery. She understands the risks and benefits and the potential for abortion of the case due to technical difficulties. We will move forward with surgery in a timely fashion. Patient does not have a primary care physician. We will obtain preoperative anesthesia evaluation.

## 2020-08-26 NOTE — Progress Notes (Signed)
PCP/OBGYN: [name] , MD Cardiologist -   Chest x-ray - DOS EKG -  Stress Test -  ECHO -  Cardiac Cath -    COVID TEST- 08/25/20 negative   Anesthesia review: yes   -------------  SDW INSTRUCTIONS:  Your procedure is scheduled on 08/27/20. Please report to [facility] Main Entrance "A" at 1:15 P.M., and check in at the Admitting office. Call this number if you have problems the morning of surgery: [phone]   Remember: Do not eat or drink after midnight the night before your surgery   Medications to take morning of surgery with a sip of water include: ALPRAZolam Prudy Feeler) if needed ANORO ELLIPTA  HYDROcodone-acetaminophen (NORCO/VICODIN) methocarbamol (ROBAXIN) if needed ondansetron [facility])  As of today, STOP taking any meloxicam (MOBIC), Aspirin (unless otherwise instructed by your surgeon), Aleve, Naproxen, Ibuprofen, Motrin, Advil, Goody's, BC's, all herbal medications, fish oil, and all vitamins.    The Morning of Surgery Do not wear jewelry, make-up or nail polish. Do not wear lotions, powders, or perfumes, or deodorant Do not shave 48 hours prior to surgery.   Do not bring valuables to the hospital. [facility] is not responsible for any belongings or valuables. If you are a smoker, DO NOT Smoke 24 hours prior to surgery If you wear a CPAP at night please bring your mask the morning of surgery  Remember that you must have someone to transport you home after your surgery, and remain with you for 24 hours if you are discharged the same day. Please bring cases for contacts, glasses, hearing aids, dentures or bridgework because it cannot be worn into surgery.   Patients discharged the day of surgery will not be allowed to drive home.   Please shower the NIGHT BEFORE SURGERY and the MORNING OF SURGERY with DIAL Soap. Wear comfortable clothes the morning of surgery. Oral Hygiene is also important to reduce your risk of infection.  Remember - BRUSH YOUR TEETH THE MORNING OF  SURGERY WITH YOUR REGULAR TOOTHPASTE  Patient denies shortness of breath, fever, cough and chest pain.

## 2020-08-26 NOTE — H&P (Deleted)
  The note originally documented on this encounter has been moved the the encounter in which it belongs.  

## 2020-08-27 ENCOUNTER — Ambulatory Visit (HOSPITAL_COMMUNITY): Payer: Medicare Other

## 2020-08-27 ENCOUNTER — Ambulatory Visit (HOSPITAL_COMMUNITY)
Admission: RE | Admit: 2020-08-27 | Discharge: 2020-08-27 | Disposition: A | Discharge status: Home / Self Care | Payer: Medicare Other | Attending: Orthopedic Surgery | Admitting: Orthopedic Surgery

## 2020-08-27 ENCOUNTER — Ambulatory Visit (HOSPITAL_COMMUNITY): Payer: Medicare Other | Admitting: Physician Assistant

## 2020-08-27 ENCOUNTER — Encounter (HOSPITAL_COMMUNITY)
Admission: RE | Disposition: A | Discharge status: Home / Self Care | Payer: Self-pay | Source: Home / Self Care | Attending: Orthopedic Surgery

## 2020-08-27 ENCOUNTER — Encounter (HOSPITAL_COMMUNITY): Payer: Self-pay | Admitting: Orthopedic Surgery

## 2020-08-27 DIAGNOSIS — Z87891 Personal history of nicotine dependence: Secondary | ICD-10-CM | POA: Insufficient documentation

## 2020-08-27 DIAGNOSIS — M8008XA Age-related osteoporosis with current pathological fracture, vertebra(e), initial encounter for fracture: Secondary | ICD-10-CM | POA: Diagnosis present

## 2020-08-27 DIAGNOSIS — Z79899 Other long term (current) drug therapy: Secondary | ICD-10-CM | POA: Diagnosis not present

## 2020-08-27 DIAGNOSIS — Z01818 Encounter for other preprocedural examination: Secondary | ICD-10-CM

## 2020-08-27 DIAGNOSIS — Z419 Encounter for procedure for purposes other than remedying health state, unspecified: Secondary | ICD-10-CM

## 2020-08-27 HISTORY — PX: KYPHOPLASTY: SHX5884

## 2020-08-27 LAB — CBC
HCT: 49.4 % — ABNORMAL HIGH (ref 36.0–46.0)
Hemoglobin: 16.8 g/dL — ABNORMAL HIGH (ref 12.0–15.0)
MCH: 36.4 pg — ABNORMAL HIGH (ref 26.0–34.0)
MCHC: 34 g/dL (ref 30.0–36.0)
MCV: 106.9 fL — ABNORMAL HIGH (ref 80.0–100.0)
Platelets: 224 10*3/uL (ref 150–400)
RBC: 4.62 MIL/uL (ref 3.87–5.11)
RDW: 16.1 % — ABNORMAL HIGH (ref 11.5–15.5)
WBC: 5.3 10*3/uL (ref 4.0–10.5)
nRBC: 0 % (ref 0.0–0.2)

## 2020-08-27 LAB — BASIC METABOLIC PANEL
Anion gap: 6 (ref 5–15)
BUN: <5 mg/dL — ABNORMAL LOW (ref 6–20)
CO2: 31 mmol/L (ref 22–32)
Calcium: 9 mg/dL (ref 8.9–10.3)
Chloride: 101 mmol/L (ref 98–111)
Creatinine, Ser: 0.8 mg/dL (ref 0.44–1.00)
GFR, Estimated: >60 mL/min (ref >60)
Glucose, Bld: 95 mg/dL (ref 70–99)
Potassium: 3.7 mmol/L (ref 3.5–5.1)
Sodium: 138 mmol/L (ref 135–145)

## 2020-08-27 LAB — URINALYSIS, ROUTINE W REFLEX MICROSCOPIC
Bilirubin Urine: NEGATIVE
Glucose, UA: NEGATIVE mg/dL
Hgb urine dipstick: NEGATIVE
Ketones, ur: NEGATIVE mg/dL
Leukocytes,Ua: NEGATIVE
Nitrite: NEGATIVE
Protein, ur: NEGATIVE mg/dL
Specific Gravity, Urine: 1.006 (ref 1.005–1.030)
pH: 6 (ref 5.0–8.0)

## 2020-08-27 LAB — SURGICAL PCR SCREEN
MRSA, PCR: NEGATIVE
Staphylococcus aureus: POSITIVE — AB

## 2020-08-27 LAB — APTT: aPTT: 25 seconds (ref 24–36)

## 2020-08-27 LAB — PROTIME-INR
INR: 0.9 (ref 0.8–1.2)
Prothrombin Time: 12.1 seconds (ref 11.4–15.2)

## 2020-08-27 SURGERY — KYPHOPLASTY
Anesthesia: Monitor Anesthesia Care

## 2020-08-27 MED ORDER — BUPIVACAINE HCL (PF) 0.25 % IJ SOLN
INTRAMUSCULAR | Status: AC
  Filled 2020-08-27: qty 30

## 2020-08-27 MED ORDER — 0.9 % SODIUM CHLORIDE (POUR BTL) OPTIME
TOPICAL | Status: DC | PRN
  Administered 2020-08-27 (×2): 1000 mL

## 2020-08-27 MED ORDER — FENTANYL CITRATE (PF) 100 MCG/2ML IJ SOLN
INTRAMUSCULAR | Status: DC | PRN
  Administered 2020-08-27 (×4): 25 ug via INTRAVENOUS
  Administered 2020-08-27: 50 ug via INTRAVENOUS

## 2020-08-27 MED ORDER — FENTANYL CITRATE (PF) 100 MCG/2ML IJ SOLN
25.0000 ug | INTRAMUSCULAR | Status: DC | PRN
Start: 2020-08-27 — End: 2020-08-27

## 2020-08-27 MED ORDER — ROCURONIUM BROMIDE 10 MG/ML (PF) SYRINGE
PREFILLED_SYRINGE | INTRAVENOUS | Status: AC
  Filled 2020-08-27: qty 10

## 2020-08-27 MED ORDER — EPINEPHRINE PF 1 MG/ML IJ SOLN
INTRAMUSCULAR | Status: AC
  Filled 2020-08-27: qty 1

## 2020-08-27 MED ORDER — TRAMADOL HCL 50 MG PO TABS: 50.0000 mg | ORAL_TABLET | Freq: Three times a day (TID) | ORAL | 0 refills | Status: DC | PRN

## 2020-08-27 MED ORDER — OXYCODONE HCL 5 MG/5ML PO SOLN: 5.0000 mg | Freq: Once | ORAL | Status: DC | PRN

## 2020-08-27 MED ORDER — MIDAZOLAM HCL 5 MG/5ML IJ SOLN
INTRAMUSCULAR | Status: DC | PRN
  Administered 2020-08-27: 2 mg via INTRAVENOUS

## 2020-08-27 MED ORDER — CHLORHEXIDINE GLUCONATE 0.12 % MT SOLN
15.0000 mL | Freq: Once | OROMUCOSAL | Status: AC
  Administered 2020-08-27: 15 mL via OROMUCOSAL
  Filled 2020-08-27: qty 15

## 2020-08-27 MED ORDER — LACTATED RINGERS IV SOLN: INTRAVENOUS | Status: DC

## 2020-08-27 MED ORDER — PROPOFOL 10 MG/ML IV BOLUS
INTRAVENOUS | Status: DC | PRN
  Administered 2020-08-27: 20 mg via INTRAVENOUS

## 2020-08-27 MED ORDER — BUPIVACAINE-EPINEPHRINE 0.25% -1:200000 IJ SOLN
INTRAMUSCULAR | Status: DC | PRN
  Administered 2020-08-27: 7.5 mL
  Administered 2020-08-27: 10 mL

## 2020-08-27 MED ORDER — ONDANSETRON HCL 4 MG PO TABS: 4.0000 mg | ORAL_TABLET | Freq: Three times a day (TID) | ORAL | 1 refills | Status: AC | PRN

## 2020-08-27 MED ORDER — PROPOFOL 10 MG/ML IV BOLUS
INTRAVENOUS | Status: AC
  Filled 2020-08-27: qty 20

## 2020-08-27 MED ORDER — ACETAMINOPHEN 10 MG/ML IV SOLN
INTRAVENOUS | Status: AC
  Filled 2020-08-27: qty 100

## 2020-08-27 MED ORDER — PROPOFOL 1000 MG/100ML IV EMUL
INTRAVENOUS | Status: AC
  Filled 2020-08-27: qty 100

## 2020-08-27 MED ORDER — BUPIVACAINE LIPOSOME 1.3 % IJ SUSP
INTRAMUSCULAR | Status: AC
  Filled 2020-08-27: qty 20

## 2020-08-27 MED ORDER — PHENYLEPHRINE 40 MCG/ML (10ML) SYRINGE FOR IV PUSH (FOR BLOOD PRESSURE SUPPORT)
PREFILLED_SYRINGE | INTRAVENOUS | Status: AC
  Filled 2020-08-27: qty 10

## 2020-08-27 MED ORDER — HYDROCODONE-ACETAMINOPHEN 5-325 MG PO TABS
1.0000 | ORAL_TABLET | Freq: Three times a day (TID) | ORAL | 0 refills | Status: AC | PRN
Start: 2020-08-27 — End: 2020-09-01

## 2020-08-27 MED ORDER — PROPOFOL 500 MG/50ML IV EMUL
INTRAVENOUS | Status: DC | PRN
  Administered 2020-08-27: 50 ug/kg/min via INTRAVENOUS

## 2020-08-27 MED ORDER — MIDAZOLAM HCL 2 MG/2ML IJ SOLN
INTRAMUSCULAR | Status: AC
  Filled 2020-08-27: qty 2

## 2020-08-27 MED ORDER — OXYCODONE HCL 5 MG PO TABS: 5.0000 mg | ORAL_TABLET | Freq: Once | ORAL | Status: DC | PRN

## 2020-08-27 MED ORDER — BUPIVACAINE LIPOSOME 1.3 % IJ SUSP
INTRAMUSCULAR | Status: DC | PRN
  Administered 2020-08-27: 7.5 mL

## 2020-08-27 MED ORDER — IOPAMIDOL (ISOVUE-300) INJECTION 61%
INTRAVENOUS | Status: DC | PRN
  Administered 2020-08-27: 50 mL

## 2020-08-27 MED ORDER — LIDOCAINE 2% (20 MG/ML) 5 ML SYRINGE
INTRAMUSCULAR | Status: AC
  Filled 2020-08-27: qty 10

## 2020-08-27 MED ORDER — FENTANYL CITRATE (PF) 250 MCG/5ML IJ SOLN
INTRAMUSCULAR | Status: AC
  Filled 2020-08-27: qty 5

## 2020-08-27 MED ORDER — CEFAZOLIN SODIUM-DEXTROSE 2-4 GM/100ML-% IV SOLN
2.0000 g | INTRAVENOUS | Status: AC
  Administered 2020-08-27: 2 g via INTRAVENOUS
  Filled 2020-08-27: qty 100

## 2020-08-27 MED ORDER — LIDOCAINE 2% (20 MG/ML) 5 ML SYRINGE
INTRAMUSCULAR | Status: DC | PRN
  Administered 2020-08-27: 40 mg via INTRAVENOUS

## 2020-08-27 MED ORDER — PROMETHAZINE HCL 25 MG/ML IJ SOLN
6.2500 mg | INTRAMUSCULAR | Status: DC | PRN
Start: 2020-08-27 — End: 2020-08-27

## 2020-08-27 MED ORDER — ACETAMINOPHEN 10 MG/ML IV SOLN
INTRAVENOUS | Status: DC | PRN
  Administered 2020-08-27: 1000 mg via INTRAVENOUS

## 2020-08-27 MED ORDER — ORAL CARE MOUTH RINSE: 15.0000 mL | Freq: Once | OROMUCOSAL | Status: AC

## 2020-08-27 SURGICAL SUPPLY — 37 items
BLADE SURG 15 STRL LF DISP TIS (BLADE) ×1 IMPLANT
BLADE SURG 15 STRL SS (BLADE) ×2
BNDG ADH 1X3 SHEER STRL LF (GAUZE/BANDAGES/DRESSINGS) ×4 IMPLANT
CEMENT KYPHON CX01A KIT/MIXER (Cement) ×2 IMPLANT
COVER MAYO STAND STRL (DRAPES) ×2 IMPLANT
COVER SURGICAL LIGHT HANDLE (MISCELLANEOUS) ×2 IMPLANT
DERMABOND ADHESIVE PROPEN (GAUZE/BANDAGES/DRESSINGS) ×1
DERMABOND ADVANCED (GAUZE/BANDAGES/DRESSINGS) ×1
DERMABOND ADVANCED .7 DNX12 (GAUZE/BANDAGES/DRESSINGS) ×1 IMPLANT
DERMABOND ADVANCED .7 DNX6 (GAUZE/BANDAGES/DRESSINGS) ×1 IMPLANT
DRAPE C-ARM 42X72 X-RAY (DRAPES) ×4 IMPLANT
DRAPE INCISE IOBAN 66X45 STRL (DRAPES) ×2 IMPLANT
DRAPE LAPAROTOMY T 102X78X121 (DRAPES) ×2 IMPLANT
DRAPE WARM FLUID 44X44 (DRAPES) ×2 IMPLANT
DURAPREP 26ML APPLICATOR (WOUND CARE) ×2 IMPLANT
GLOVE BIO SURGEON STRL SZ 6.5 (GLOVE) ×2 IMPLANT
GLOVE BIOGEL PI IND STRL 8.5 (GLOVE) ×1 IMPLANT
GLOVE BIOGEL PI INDICATOR 8.5 (GLOVE) ×1
GLOVE SS BIOGEL STRL SZ 8.5 (GLOVE) ×1 IMPLANT
GLOVE SUPERSENSE BIOGEL SZ 8.5 (GLOVE) ×1
GLOVE SURG UNDER POLY LF SZ6.5 (GLOVE) ×2 IMPLANT
GOWN STRL REUS W/ TWL LRG LVL3 (GOWN DISPOSABLE) ×2 IMPLANT
GOWN STRL REUS W/TWL 2XL LVL3 (GOWN DISPOSABLE) ×2 IMPLANT
GOWN STRL REUS W/TWL LRG LVL3 (GOWN DISPOSABLE) ×4
KIT BASIN OR (CUSTOM PROCEDURE TRAY) ×2 IMPLANT
KIT TURNOVER KIT B (KITS) ×2 IMPLANT
NEEDLE HYPO 22GX1.5 SAFETY (NEEDLE) ×2 IMPLANT
NEEDLE SPNL 22GX3.5 QUINCKE BK (NEEDLE) ×2 IMPLANT
NS IRRIG 1000ML POUR BTL (IV SOLUTION) ×2 IMPLANT
PACK SURGICAL SETUP 50X90 (CUSTOM PROCEDURE TRAY) ×2 IMPLANT
PAD ARMBOARD 7.5X6 YLW CONV (MISCELLANEOUS) ×4 IMPLANT
SPONGE LAP 4X18 RFD (DISPOSABLE) ×2 IMPLANT
SUT MNCRL AB 3-0 PS2 18 (SUTURE) ×2 IMPLANT
SYR CONTROL 10ML LL (SYRINGE) ×2 IMPLANT
TOWEL GREEN STERILE (TOWEL DISPOSABLE) ×2 IMPLANT
TRAY KYPHOPAK 15/3 ONESTEP 1ST (MISCELLANEOUS) ×2 IMPLANT
WATER STERILE IRR 1000ML POUR (IV SOLUTION) ×2 IMPLANT

## 2020-08-27 NOTE — Anesthesia Procedure Notes (Signed)
Procedure Name: MAC Date/Time: 08/27/2020 3:54 PM Performed by: Candis Shine, CRNA Pre-anesthesia Checklist: Patient identified, Emergency Drugs available, Suction available, Patient being monitored and Timeout performed Patient Re-evaluated:Patient Re-evaluated prior to induction Oxygen Delivery Method: Simple face mask Dental Injury: Teeth and Oropharynx as per pre-operative assessment

## 2020-08-27 NOTE — Op Note (Signed)
OPERATIVE REPORT  DATE OF SURGERY: 08/27/2020  PATIENT NAME:  Rebecca Wilson MRN: [id number] DOB: Jun 12, 1960  PCP: Freddy Finner, MD  PRE-OPERATIVE DIAGNOSIS: T12 osteoporotic compression fracture  POST-OPERATIVE DIAGNOSIS: Same  PROCEDURE:   T12 kyphoplasty  SURGEON:  Venita Lick, MD  PHYSICIAN ASSISTANT: None  ANESTHESIA: IV sedation with local anesthesia  EBL: None  Medications: 1/4% Marcaine with epinephrine as well as Exparel  BRIEF HISTORY: Rebecca Wilson is a 60 y.o. female who presented with a 1 month history of severe back pain.  Imaging studies confirmed a T12 compression fracture.  Patient had no significant trauma.  Because of the severity of her pain she elected to move forward with a T12 kyphoplasty.  All appropriate risks benefits and alternatives to surgery were discussed with the patient and consent was obtained.  PROCEDURE DETAILS: Patient was brought into the operating room. After successful induction of general anesthesia a Time Out was done. This confirmed all pertinent important data.  The back was then prepped and draped in a standard fashion.  IV sedation was provided and using 2 fluoroscopic imaging machines I visualized the T12 vertebral body in both the AP and lateral planes.  Once identify the T12 vertebral body I localized and marked out the lateral aspect of the pedicle.  I then used quarter percent Marcaine with epinephrine to obtain local anesthesia.  I then used a spinal needle and advanced down to the lateral aspect of the pedicle and injected the quarter percent Marcaine with Exparel in the deep paraspinal muscle and along the periosteum.  This was done bilaterally to improve anesthesia.  A small incision was made and the Jamshidi needles were advanced percutaneously to the lateral aspect of the T12 pedicle.  I then advanced using a slight extrapedicular approach down the T12 pedicle.  As I neared the medial wall of the pedicle on the AP view I  confirmed that I was just beyond the posterior wall of the vertebral body in the lateral view.  I then advanced into the vertebral body.  I then placed the drill and then sounded the canal to ensure I had a solid bony canal.  I then inserted the inflatable bone tamps until I had an adequate bone void.  I deflated one of the inflatable bone tamp and then inserted the fast acting cement.  I then deflated the contralateral and then inserted 1-1/2 cc of cement on the side.  I monitored the insertion of the cement with AP and lateral fluoroscopy.  I then inserted another half cc of cement on either side.  The cement was able to fill the midportion of the vertebral body up into the superior anterior corner long the fracture.  There is no leak of cement noted.  The cement was allowed to harden and I remove the Jamshidi needle.  X-rays were taken which demonstrated satisfactory fill of the T12 vertebral body with cement contacting across the midline and into the anterior aspect of the vertebral body.  3-0 Monocryl was used to close the skin edges and then I placed Dermabond.  A Band-Aid was applied and the patient was transferred to the PACU without incident.  The end of the case all needle and sponge counts were correct.  Patient was hemodynamically and neurovascularly intact at the conclusion of the case. Venita Lick, MD 08/27/2020 4:40 PM

## 2020-08-27 NOTE — Brief Op Note (Signed)
08/27/2020  4:47 PM  PATIENT:  Rebecca Wilson  60 y.o. female  PRE-OPERATIVE DIAGNOSIS:  T12 Compression fracture  POST-OPERATIVE DIAGNOSIS:  T12 Compression fracture  PROCEDURE:  Procedure(s): KYPHOPLASTY T12 (N/A)  SURGEON:  Surgeon(s) and Role:    Venita Lick, MD - Primary  PHYSICIAN ASSISTANT: none  ASSISTANTS: none   ANESTHESIA:   local and IV sedation  EBL:  minimal   BLOOD ADMINISTERED:none  DRAINS: none   LOCAL MEDICATIONS USED:  MARCAINE    and OTHER exparel  SPECIMEN:  No Specimen  DISPOSITION OF SPECIMEN:  N/A  COUNTS:  YES  TOURNIQUET:  * No tourniquets in log *  DICTATION: .Dragon Dictation  PLAN OF CARE: Discharge to home after PACU  PATIENT DISPOSITION:  PACU - hemodynamically stable.

## 2020-08-27 NOTE — Anesthesia Postprocedure Evaluation (Signed)
Anesthesia Post Note  Patient: Rebecca Wilson  Procedure(s) Performed: KYPHOPLASTY T12 (N/A )     Patient location during evaluation: PACU Anesthesia Type: MAC Level of consciousness: awake and alert Pain management: pain level controlled Vital Signs Assessment: post-procedure vital signs reviewed and stable Respiratory status: spontaneous breathing, nonlabored ventilation and respiratory function stable Cardiovascular status: stable and blood pressure returned to baseline Anesthetic complications: no   No complications documented.  Last Vitals:  Vitals:   08/27/20 1718 08/27/20 1733  BP: 94/65 102/63  Pulse: 83 82  Resp: 18 12  Temp:  36.9 C  SpO2: 93% 96%    Last Pain:  Vitals:   08/27/20 1718  TempSrc:   PainSc: 0-No pain                 Audry Pili

## 2020-08-27 NOTE — H&P (Signed)
Addendum H&P  Patient continues to have significant back pain secondary to the compression fracture.  Despite appropriate treatment her quality of life has continued to deteriorate and so she would like to move forward with surgery.  I reviewed the surgical procedure, as well as the risks, benefits, alternatives to surgery in great detail.  She is expressed an understanding of these risks as well as a willingness to move forward with surgery.  There is been no change in her clinical exam since her last office visit of 08/26/2020.

## 2020-08-27 NOTE — Anesthesia Preprocedure Evaluation (Addendum)
Anesthesia Evaluation  Patient identified by MRN, date of birth, ID band Patient awake    Reviewed: Allergy & Precautions, NPO status , Patient's Chart, lab work & pertinent test results  History of Anesthesia Complications Negative for: history of anesthetic complications  Airway Mallampati: II  TM Distance: >3 FB Neck ROM: Full    Dental  (+) Dental Advisory Given, Edentulous Upper, Partial Lower   Pulmonary asthma , Current SmokerPatient did not abstain from smoking.,    Pulmonary exam normal        Cardiovascular negative cardio ROS Normal cardiovascular exam     Neuro/Psych PSYCHIATRIC DISORDERS Anxiety Depression negative neurological ROS     GI/Hepatic Neg liver ROS, GERD  Medicated and Controlled,  Endo/Other   Obesity   Renal/GU negative Renal ROS     Musculoskeletal negative musculoskeletal ROS (+)   Abdominal   Peds  Hematology negative hematology ROS (+)   Anesthesia Other Findings Covid test negative   Reproductive/Obstetrics                            Anesthesia Physical Anesthesia Plan  ASA: II  Anesthesia Plan: MAC   Post-op Pain Management:    Induction: Intravenous  PONV Risk Score and Plan: 2 and Propofol infusion and Treatment may vary due to age or medical condition  Airway Management Planned: Natural Airway and Simple Face Mask  Additional Equipment: None  Intra-op Plan:   Post-operative Plan:   Informed Consent: I have reviewed the patients History and Physical, chart, labs and discussed the procedure including the risks, benefits and alternatives for the proposed anesthesia with the patient or authorized representative who has indicated his/her understanding and acceptance.       Plan Discussed with: CRNA, Anesthesiologist and Surgeon  Anesthesia Plan Comments:        Anesthesia Quick Evaluation

## 2020-08-27 NOTE — Transfer of Care (Signed)
Immediate Anesthesia Transfer of Care Note  Patient: Rebecca Wilson  Procedure(s) Performed: KYPHOPLASTY T12 (N/A )  Patient Location: PACU  Anesthesia Type:MAC  Level of Consciousness: awake, alert  and oriented  Airway & Oxygen Therapy: Patient Spontanous Breathing  Post-op Assessment: Report given to RN and Post -op Vital signs reviewed and stable  Post vital signs: Reviewed and stable  Last Vitals:  Vitals Value Taken Time  BP 95/63 08/27/20 1648  Temp    Pulse 93 08/27/20 1649  Resp 14 08/27/20 1649  SpO2 94 % 08/27/20 1649  Vitals shown include unvalidated device data.  Last Pain:  Vitals:   08/27/20 1447  TempSrc:   PainSc: 8       Patients Stated Pain Goal: 3 (68/08/81 1031)  Complications: No complications documented.

## 2020-08-28 ENCOUNTER — Encounter (HOSPITAL_COMMUNITY): Payer: Self-pay | Admitting: Orthopedic Surgery

## 2020-11-29 ENCOUNTER — Inpatient Hospital Stay (HOSPITAL_COMMUNITY)
Admission: EM | Admit: 2020-11-29 | Discharge: 2020-12-28 | DRG: 064 | Disposition: E | Payer: Medicare Other | Attending: Neurology | Admitting: Neurology

## 2020-11-29 ENCOUNTER — Emergency Department (HOSPITAL_COMMUNITY): Payer: Medicare Other

## 2020-11-29 DIAGNOSIS — Z515 Encounter for palliative care: Secondary | ICD-10-CM

## 2020-11-29 DIAGNOSIS — I611 Nontraumatic intracerebral hemorrhage in hemisphere, cortical: Secondary | ICD-10-CM | POA: Diagnosis not present

## 2020-11-29 DIAGNOSIS — J449 Chronic obstructive pulmonary disease, unspecified: Secondary | ICD-10-CM | POA: Diagnosis present

## 2020-11-29 DIAGNOSIS — F10231 Alcohol dependence with withdrawal delirium: Secondary | ICD-10-CM | POA: Diagnosis not present

## 2020-11-29 DIAGNOSIS — Z7951 Long term (current) use of inhaled steroids: Secondary | ICD-10-CM

## 2020-11-29 DIAGNOSIS — I10 Essential (primary) hypertension: Secondary | ICD-10-CM | POA: Diagnosis present

## 2020-11-29 DIAGNOSIS — I63412 Cerebral infarction due to embolism of left middle cerebral artery: Principal | ICD-10-CM | POA: Diagnosis present

## 2020-11-29 DIAGNOSIS — L409 Psoriasis, unspecified: Secondary | ICD-10-CM | POA: Diagnosis present

## 2020-11-29 DIAGNOSIS — G8191 Hemiplegia, unspecified affecting right dominant side: Secondary | ICD-10-CM | POA: Diagnosis present

## 2020-11-29 DIAGNOSIS — Z79899 Other long term (current) drug therapy: Secondary | ICD-10-CM

## 2020-11-29 DIAGNOSIS — E870 Hyperosmolality and hypernatremia: Secondary | ICD-10-CM | POA: Diagnosis not present

## 2020-11-29 DIAGNOSIS — R4701 Aphasia: Secondary | ICD-10-CM | POA: Diagnosis present

## 2020-11-29 DIAGNOSIS — E876 Hypokalemia: Secondary | ICD-10-CM | POA: Diagnosis not present

## 2020-11-29 DIAGNOSIS — S2249XA Multiple fractures of ribs, unspecified side, initial encounter for closed fracture: Secondary | ICD-10-CM

## 2020-11-29 DIAGNOSIS — R0603 Acute respiratory distress: Secondary | ICD-10-CM | POA: Diagnosis not present

## 2020-11-29 DIAGNOSIS — I6340 Cerebral infarction due to embolism of unspecified cerebral artery: Secondary | ICD-10-CM | POA: Diagnosis present

## 2020-11-29 DIAGNOSIS — I63512 Cerebral infarction due to unspecified occlusion or stenosis of left middle cerebral artery: Secondary | ICD-10-CM | POA: Diagnosis present

## 2020-11-29 DIAGNOSIS — Z4659 Encounter for fitting and adjustment of other gastrointestinal appliance and device: Secondary | ICD-10-CM

## 2020-11-29 DIAGNOSIS — I6522 Occlusion and stenosis of left carotid artery: Secondary | ICD-10-CM | POA: Diagnosis present

## 2020-11-29 DIAGNOSIS — R29724 NIHSS score 24: Secondary | ICD-10-CM | POA: Diagnosis present

## 2020-11-29 DIAGNOSIS — R000 Tachycardia, unspecified: Secondary | ICD-10-CM | POA: Diagnosis not present

## 2020-11-29 DIAGNOSIS — G936 Cerebral edema: Secondary | ICD-10-CM | POA: Diagnosis present

## 2020-11-29 DIAGNOSIS — Z20822 Contact with and (suspected) exposure to covid-19: Secondary | ICD-10-CM | POA: Diagnosis present

## 2020-11-29 DIAGNOSIS — I63232 Cerebral infarction due to unspecified occlusion or stenosis of left carotid arteries: Secondary | ICD-10-CM | POA: Diagnosis present

## 2020-11-29 DIAGNOSIS — M4802 Spinal stenosis, cervical region: Secondary | ICD-10-CM | POA: Diagnosis present

## 2020-11-29 DIAGNOSIS — G510 Bell's palsy: Secondary | ICD-10-CM | POA: Diagnosis present

## 2020-11-29 DIAGNOSIS — I639 Cerebral infarction, unspecified: Secondary | ICD-10-CM | POA: Diagnosis present

## 2020-11-29 DIAGNOSIS — G9349 Other encephalopathy: Secondary | ICD-10-CM | POA: Diagnosis not present

## 2020-11-29 DIAGNOSIS — J9602 Acute respiratory failure with hypercapnia: Secondary | ICD-10-CM | POA: Diagnosis not present

## 2020-11-29 DIAGNOSIS — F17210 Nicotine dependence, cigarettes, uncomplicated: Secondary | ICD-10-CM | POA: Diagnosis present

## 2020-11-29 DIAGNOSIS — S2239XD Fracture of one rib, unspecified side, subsequent encounter for fracture with routine healing: Secondary | ICD-10-CM

## 2020-11-29 DIAGNOSIS — S42001A Fracture of unspecified part of right clavicle, initial encounter for closed fracture: Secondary | ICD-10-CM | POA: Diagnosis present

## 2020-11-29 DIAGNOSIS — J690 Pneumonitis due to inhalation of food and vomit: Secondary | ICD-10-CM | POA: Diagnosis present

## 2020-11-29 DIAGNOSIS — F419 Anxiety disorder, unspecified: Secondary | ICD-10-CM | POA: Diagnosis present

## 2020-11-29 DIAGNOSIS — Y92230 Patient room in hospital as the place of occurrence of the external cause: Secondary | ICD-10-CM | POA: Diagnosis present

## 2020-11-29 DIAGNOSIS — W06XXXA Fall from bed, initial encounter: Secondary | ICD-10-CM | POA: Diagnosis present

## 2020-11-29 DIAGNOSIS — F319 Bipolar disorder, unspecified: Secondary | ICD-10-CM | POA: Diagnosis present

## 2020-11-29 DIAGNOSIS — G935 Compression of brain: Secondary | ICD-10-CM | POA: Diagnosis present

## 2020-11-29 DIAGNOSIS — R233 Spontaneous ecchymoses: Secondary | ICD-10-CM | POA: Diagnosis present

## 2020-11-29 DIAGNOSIS — Z66 Do not resuscitate: Secondary | ICD-10-CM | POA: Diagnosis present

## 2020-11-29 DIAGNOSIS — R54 Age-related physical debility: Secondary | ICD-10-CM | POA: Diagnosis present

## 2020-11-29 DIAGNOSIS — I63429 Cerebral infarction due to embolism of unspecified anterior cerebral artery: Secondary | ICD-10-CM | POA: Diagnosis present

## 2020-11-29 MED ORDER — SODIUM CHLORIDE 0.9% FLUSH
3.0000 mL | Freq: Once | INTRAVENOUS | Status: AC
Start: 2020-11-30 — End: 2020-11-30
  Administered 2020-11-30: 3 mL via INTRAVENOUS

## 2020-11-29 NOTE — ED Notes (Signed)
Daughter inlaw Whitney Post [phone] would like an update

## 2020-11-30 ENCOUNTER — Emergency Department (HOSPITAL_COMMUNITY): Payer: Medicare Other

## 2020-11-30 ENCOUNTER — Inpatient Hospital Stay (HOSPITAL_COMMUNITY): Payer: Medicare Other

## 2020-11-30 DIAGNOSIS — G935 Compression of brain: Secondary | ICD-10-CM | POA: Diagnosis present

## 2020-11-30 DIAGNOSIS — F1010 Alcohol abuse, uncomplicated: Secondary | ICD-10-CM

## 2020-11-30 DIAGNOSIS — I611 Nontraumatic intracerebral hemorrhage in hemisphere, cortical: Secondary | ICD-10-CM | POA: Diagnosis not present

## 2020-11-30 DIAGNOSIS — Z66 Do not resuscitate: Secondary | ICD-10-CM | POA: Diagnosis present

## 2020-11-30 DIAGNOSIS — R29724 NIHSS score 24: Secondary | ICD-10-CM | POA: Diagnosis present

## 2020-11-30 DIAGNOSIS — S42001A Fracture of unspecified part of right clavicle, initial encounter for closed fracture: Secondary | ICD-10-CM | POA: Diagnosis present

## 2020-11-30 DIAGNOSIS — I6522 Occlusion and stenosis of left carotid artery: Secondary | ICD-10-CM | POA: Diagnosis not present

## 2020-11-30 DIAGNOSIS — I63412 Cerebral infarction due to embolism of left middle cerebral artery: Secondary | ICD-10-CM | POA: Diagnosis present

## 2020-11-30 DIAGNOSIS — F419 Anxiety disorder, unspecified: Secondary | ICD-10-CM | POA: Diagnosis present

## 2020-11-30 DIAGNOSIS — G8191 Hemiplegia, unspecified affecting right dominant side: Secondary | ICD-10-CM | POA: Diagnosis present

## 2020-11-30 DIAGNOSIS — I63429 Cerebral infarction due to embolism of unspecified anterior cerebral artery: Secondary | ICD-10-CM | POA: Diagnosis present

## 2020-11-30 DIAGNOSIS — I63512 Cerebral infarction due to unspecified occlusion or stenosis of left middle cerebral artery: Secondary | ICD-10-CM

## 2020-11-30 DIAGNOSIS — G936 Cerebral edema: Secondary | ICD-10-CM | POA: Diagnosis present

## 2020-11-30 DIAGNOSIS — F17210 Nicotine dependence, cigarettes, uncomplicated: Secondary | ICD-10-CM | POA: Diagnosis present

## 2020-11-30 DIAGNOSIS — R4701 Aphasia: Secondary | ICD-10-CM | POA: Diagnosis present

## 2020-11-30 DIAGNOSIS — W06XXXA Fall from bed, initial encounter: Secondary | ICD-10-CM | POA: Diagnosis present

## 2020-11-30 DIAGNOSIS — J9602 Acute respiratory failure with hypercapnia: Secondary | ICD-10-CM | POA: Diagnosis not present

## 2020-11-30 DIAGNOSIS — I63232 Cerebral infarction due to unspecified occlusion or stenosis of left carotid arteries: Secondary | ICD-10-CM

## 2020-11-30 DIAGNOSIS — F319 Bipolar disorder, unspecified: Secondary | ICD-10-CM | POA: Diagnosis present

## 2020-11-30 DIAGNOSIS — S2241XG Multiple fractures of ribs, right side, subsequent encounter for fracture with delayed healing: Secondary | ICD-10-CM | POA: Diagnosis not present

## 2020-11-30 DIAGNOSIS — I6340 Cerebral infarction due to embolism of unspecified cerebral artery: Secondary | ICD-10-CM | POA: Diagnosis present

## 2020-11-30 DIAGNOSIS — S2242XA Multiple fractures of ribs, left side, initial encounter for closed fracture: Secondary | ICD-10-CM | POA: Insufficient documentation

## 2020-11-30 DIAGNOSIS — Y92230 Patient room in hospital as the place of occurrence of the external cause: Secondary | ICD-10-CM | POA: Diagnosis present

## 2020-11-30 DIAGNOSIS — I10 Essential (primary) hypertension: Secondary | ICD-10-CM | POA: Diagnosis present

## 2020-11-30 DIAGNOSIS — G9349 Other encephalopathy: Secondary | ICD-10-CM | POA: Diagnosis not present

## 2020-11-30 DIAGNOSIS — Z20822 Contact with and (suspected) exposure to covid-19: Secondary | ICD-10-CM | POA: Diagnosis present

## 2020-11-30 DIAGNOSIS — J690 Pneumonitis due to inhalation of food and vomit: Secondary | ICD-10-CM | POA: Diagnosis present

## 2020-11-30 DIAGNOSIS — F10231 Alcohol dependence with withdrawal delirium: Secondary | ICD-10-CM | POA: Diagnosis not present

## 2020-11-30 DIAGNOSIS — E785 Hyperlipidemia, unspecified: Secondary | ICD-10-CM | POA: Insufficient documentation

## 2020-11-30 DIAGNOSIS — R000 Tachycardia, unspecified: Secondary | ICD-10-CM | POA: Diagnosis not present

## 2020-11-30 DIAGNOSIS — J449 Chronic obstructive pulmonary disease, unspecified: Secondary | ICD-10-CM | POA: Diagnosis present

## 2020-11-30 DIAGNOSIS — I639 Cerebral infarction, unspecified: Secondary | ICD-10-CM | POA: Diagnosis present

## 2020-11-30 DIAGNOSIS — Z515 Encounter for palliative care: Secondary | ICD-10-CM | POA: Diagnosis not present

## 2020-11-30 DIAGNOSIS — E559 Vitamin D deficiency, unspecified: Secondary | ICD-10-CM | POA: Insufficient documentation

## 2020-11-30 DIAGNOSIS — E870 Hyperosmolality and hypernatremia: Secondary | ICD-10-CM | POA: Diagnosis not present

## 2020-11-30 LAB — DIFFERENTIAL
Abs Immature Granulocytes: 0.07 10*3/uL (ref 0.00–0.07)
Basophils Absolute: 0 10*3/uL (ref 0.0–0.1)
Basophils Relative: 0 %
Eosinophils Absolute: 0 10*3/uL (ref 0.0–0.5)
Eosinophils Relative: 0 %
Immature Granulocytes: 1 %
Lymphocytes Relative: 4 %
Lymphs Abs: 0.6 10*3/uL — ABNORMAL LOW (ref 0.7–4.0)
Monocytes Absolute: 1.2 10*3/uL — ABNORMAL HIGH (ref 0.1–1.0)
Monocytes Relative: 9 %
Neutro Abs: 11.7 10*3/uL — ABNORMAL HIGH (ref 1.7–7.7)
Neutrophils Relative %: 86 %

## 2020-11-30 LAB — CBC
HCT: 39.3 % (ref 36.0–46.0)
HCT: 42.6 % (ref 36.0–46.0)
Hemoglobin: 13.8 g/dL (ref 12.0–15.0)
Hemoglobin: 14.1 g/dL (ref 12.0–15.0)
MCH: 36.7 pg — ABNORMAL HIGH (ref 26.0–34.0)
MCH: 37.3 pg — ABNORMAL HIGH (ref 26.0–34.0)
MCHC: 33.1 g/dL (ref 30.0–36.0)
MCHC: 35.1 g/dL (ref 30.0–36.0)
MCV: 106.2 fL — ABNORMAL HIGH (ref 80.0–100.0)
MCV: 110.9 fL — ABNORMAL HIGH (ref 80.0–100.0)
Platelets: 351 10*3/uL (ref 150–400)
Platelets: 353 10*3/uL (ref 150–400)
RBC: 3.7 MIL/uL — ABNORMAL LOW (ref 3.87–5.11)
RBC: 3.84 MIL/uL — ABNORMAL LOW (ref 3.87–5.11)
RDW: 17.4 % — ABNORMAL HIGH (ref 11.5–15.5)
RDW: 17.5 % — ABNORMAL HIGH (ref 11.5–15.5)
WBC: 13.1 10*3/uL — ABNORMAL HIGH (ref 4.0–10.5)
WBC: 13.6 10*3/uL — ABNORMAL HIGH (ref 4.0–10.5)
nRBC: 0 % (ref 0.0–0.2)
nRBC: 0 % (ref 0.0–0.2)

## 2020-11-30 LAB — GLUCOSE, CAPILLARY: Glucose-Capillary: 116 mg/dL — ABNORMAL HIGH (ref 70–99)

## 2020-11-30 LAB — I-STAT CHEM 8, ED
BUN: 11 mg/dL (ref 6–20)
Calcium, Ion: 1 mmol/L — ABNORMAL LOW (ref 1.15–1.40)
Chloride: 105 mmol/L (ref 98–111)
Creatinine, Ser: 0.9 mg/dL (ref 0.44–1.00)
Glucose, Bld: 116 mg/dL — ABNORMAL HIGH (ref 70–99)
HCT: 43 % (ref 36.0–46.0)
Hemoglobin: 14.6 g/dL (ref 12.0–15.0)
Potassium: 4.1 mmol/L (ref 3.5–5.1)
Sodium: 141 mmol/L (ref 135–145)
TCO2: 26 mmol/L (ref 22–32)

## 2020-11-30 LAB — LIPID PANEL
Cholesterol: 167 mg/dL (ref 0–200)
HDL: 57 mg/dL (ref >40)
LDL Cholesterol: 71 mg/dL (ref 0–99)
Total CHOL/HDL Ratio: 2.9 RATIO
Triglycerides: 195 mg/dL — ABNORMAL HIGH (ref <150)
VLDL: 39 mg/dL (ref 0–40)

## 2020-11-30 LAB — CBG MONITORING, ED: Glucose-Capillary: 124 mg/dL — ABNORMAL HIGH (ref 70–99)

## 2020-11-30 LAB — HEMOGLOBIN A1C
Hgb A1c MFr Bld: 5.4 % (ref 4.8–5.6)
Mean Plasma Glucose: 108.28 mg/dL

## 2020-11-30 LAB — I-STAT BETA HCG BLOOD, ED (MC, WL, AP ONLY): I-stat hCG, quantitative: <5 m[IU]/mL (ref <5)

## 2020-11-30 LAB — RESP PANEL BY RT-PCR (FLU A&B, COVID) ARPGX2
Influenza A by PCR: NEGATIVE
Influenza B by PCR: NEGATIVE
SARS Coronavirus 2 by RT PCR: NEGATIVE

## 2020-11-30 LAB — RAPID URINE DRUG SCREEN, HOSP PERFORMED
Amphetamines: NOT DETECTED
Barbiturates: NOT DETECTED
Benzodiazepines: POSITIVE — AB
Cocaine: NOT DETECTED
Opiates: NOT DETECTED
Tetrahydrocannabinol: NOT DETECTED

## 2020-11-30 LAB — COMPREHENSIVE METABOLIC PANEL
ALT: 20 U/L (ref 0–44)
AST: 31 U/L (ref 15–41)
Albumin: 3 g/dL — ABNORMAL LOW (ref 3.5–5.0)
Alkaline Phosphatase: 101 U/L (ref 38–126)
Anion gap: 11 (ref 5–15)
BUN: 11 mg/dL (ref 6–20)
CO2: 24 mmol/L (ref 22–32)
Calcium: 8.5 mg/dL — ABNORMAL LOW (ref 8.9–10.3)
Chloride: 105 mmol/L (ref 98–111)
Creatinine, Ser: 1.05 mg/dL — ABNORMAL HIGH (ref 0.44–1.00)
GFR, Estimated: >60 mL/min (ref >60)
Glucose, Bld: 117 mg/dL — ABNORMAL HIGH (ref 70–99)
Potassium: 4.2 mmol/L (ref 3.5–5.1)
Sodium: 140 mmol/L (ref 135–145)
Total Bilirubin: 2.3 mg/dL — ABNORMAL HIGH (ref 0.3–1.2)
Total Protein: 6.9 g/dL (ref 6.5–8.1)

## 2020-11-30 LAB — CK: Total CK: 121 U/L (ref 38–234)

## 2020-11-30 LAB — PHOSPHORUS: Phosphorus: 3.2 mg/dL (ref 2.5–4.6)

## 2020-11-30 LAB — HIV ANTIBODY (ROUTINE TESTING W REFLEX): HIV Screen 4th Generation wRfx: NONREACTIVE

## 2020-11-30 LAB — APTT: aPTT: 20 seconds — ABNORMAL LOW (ref 24–36)

## 2020-11-30 LAB — PROTIME-INR
INR: 1.1 (ref 0.8–1.2)
Prothrombin Time: 14 seconds (ref 11.4–15.2)

## 2020-11-30 LAB — SODIUM
Sodium: 143 mmol/L (ref 135–145)
Sodium: 144 mmol/L (ref 135–145)

## 2020-11-30 LAB — MRSA NEXT GEN BY PCR, NASAL: MRSA by PCR Next Gen: NOT DETECTED

## 2020-11-30 LAB — MAGNESIUM: Magnesium: 1.5 mg/dL — ABNORMAL LOW (ref 1.7–2.4)

## 2020-11-30 MED ORDER — LORAZEPAM 2 MG/ML IJ SOLN: 0.0000 mg | Freq: Two times a day (BID) | INTRAMUSCULAR | Status: DC

## 2020-11-30 MED ORDER — LORAZEPAM 2 MG/ML IJ SOLN: 0.0000 mg | Freq: Four times a day (QID) | INTRAMUSCULAR | Status: DC

## 2020-11-30 MED ORDER — LORAZEPAM 1 MG PO TABS: 0.0000 mg | ORAL_TABLET | Freq: Two times a day (BID) | ORAL | Status: DC

## 2020-11-30 MED ORDER — IOHEXOL 350 MG/ML SOLN
75.0000 mL | Freq: Once | INTRAVENOUS | Status: AC | PRN
  Administered 2020-11-30: 75 mL via INTRAVENOUS

## 2020-11-30 MED ORDER — THIAMINE HCL 100 MG PO TABS: 100.0000 mg | ORAL_TABLET | Freq: Every day | ORAL | Status: DC

## 2020-11-30 MED ORDER — LORAZEPAM 2 MG/ML IJ SOLN
1.0000 mg | INTRAMUSCULAR | Status: AC | PRN
  Administered 2020-11-30: 1 mg via INTRAVENOUS
  Administered 2020-12-02: 3 mg via INTRAVENOUS
  Administered 2020-12-02: 2 mg via INTRAVENOUS
  Administered 2020-12-02: 1 mg via INTRAVENOUS
  Administered 2020-12-02 (×2): 2 mg via INTRAVENOUS
  Administered 2020-12-02: 3 mg via INTRAVENOUS
  Administered 2020-12-03 (×2): 2 mg via INTRAVENOUS
  Filled 2020-11-30 (×2): qty 1
  Filled 2020-11-30: qty 2
  Filled 2020-11-30 (×7): qty 1

## 2020-11-30 MED ORDER — ENOXAPARIN SODIUM 40 MG/0.4ML IJ SOSY
40.0000 mg | PREFILLED_SYRINGE | INTRAMUSCULAR | Status: DC
  Administered 2020-11-30 – 2020-12-03 (×4): 40 mg via SUBCUTANEOUS
  Filled 2020-11-30 (×4): qty 0.4

## 2020-11-30 MED ORDER — STROKE: EARLY STAGES OF RECOVERY BOOK
Freq: Once | Status: AC
  Filled 2020-11-30: qty 1

## 2020-11-30 MED ORDER — CLOPIDOGREL BISULFATE 75 MG PO TABS: 75.0000 mg | ORAL_TABLET | Freq: Every day | ORAL | Status: DC

## 2020-11-30 MED ORDER — CHLORHEXIDINE GLUCONATE 0.12 % MT SOLN
15.0000 mL | Freq: Two times a day (BID) | OROMUCOSAL | Status: DC
  Administered 2020-11-30 – 2020-12-03 (×7): 15 mL via OROMUCOSAL
  Filled 2020-11-30 (×2): qty 15

## 2020-11-30 MED ORDER — ACETAMINOPHEN 650 MG RE SUPP
650.0000 mg | RECTAL | Status: DC | PRN
  Administered 2020-11-30: 650 mg via RECTAL
  Filled 2020-11-30: qty 1

## 2020-11-30 MED ORDER — ASPIRIN 300 MG RE SUPP
300.0000 mg | Freq: Every day | RECTAL | Status: DC
  Administered 2020-11-30 – 2020-12-01 (×2): 300 mg via RECTAL
  Filled 2020-11-30 (×3): qty 1

## 2020-11-30 MED ORDER — THIAMINE HCL 100 MG/ML IJ SOLN: 100.0000 mg | Freq: Every day | INTRAMUSCULAR | Status: DC

## 2020-11-30 MED ORDER — THIAMINE HCL 100 MG/ML IJ SOLN
100.0000 mg | Freq: Every day | INTRAMUSCULAR | Status: DC
  Administered 2020-11-30 – 2020-12-03 (×4): 100 mg via INTRAVENOUS
  Filled 2020-11-30 (×4): qty 2

## 2020-11-30 MED ORDER — LORAZEPAM 1 MG PO TABS: 0.0000 mg | ORAL_TABLET | Freq: Four times a day (QID) | ORAL | Status: DC

## 2020-11-30 MED ORDER — ASPIRIN 81 MG PO CHEW: 81.0000 mg | CHEWABLE_TABLET | Freq: Every day | ORAL | Status: DC

## 2020-11-30 MED ORDER — CHLORHEXIDINE GLUCONATE CLOTH 2 % EX PADS
6.0000 | MEDICATED_PAD | Freq: Every day | CUTANEOUS | Status: DC
  Administered 2020-11-30 – 2020-12-03 (×5): 6 via TOPICAL

## 2020-11-30 MED ORDER — SODIUM CHLORIDE 3 % IV SOLN
INTRAVENOUS | Status: DC
  Filled 2020-11-30 (×4): qty 500

## 2020-11-30 MED ORDER — SODIUM CHLORIDE 0.9 % IV SOLN: INTRAVENOUS | Status: DC

## 2020-11-30 MED ORDER — ORAL CARE MOUTH RINSE
15.0000 mL | Freq: Two times a day (BID) | OROMUCOSAL | Status: DC
  Administered 2020-12-01 – 2020-12-03 (×6): 15 mL via OROMUCOSAL

## 2020-11-30 MED ORDER — ACETAMINOPHEN 160 MG/5ML PO SOLN
650.0000 mg | ORAL | Status: DC | PRN
  Administered 2020-12-02: 650 mg
  Filled 2020-11-30: qty 20.3

## 2020-11-30 NOTE — Progress Notes (Addendum)
STROKE TEAM PROGRESS NOTE   INTERVAL HISTORY No family at the bedside. C-collar still in place d/t fall. She is lethargic, no attempt to speak or follow commands.  Remains globally aphasic with dense right hemiplegia and right field cut awaiting a ICU bed currently still in the ER.  Vitals:   11/30/20 0545 11/30/20 0619 11/30/20 0630 11/30/20 0815  BP: (!) 154/117  (!) 162/98 138/79  Pulse: (!) 126 (!) 122 96 97  Resp: 20  15 (!) 21  SpO2: 96%  92% 95%  Weight:      Height:       CBC:  Recent Labs  Lab 12/20/2020 2358 11/30/20 0007 11/30/20 0317  WBC 13.6*  --  13.1*  NEUTROABS 11.7*  --   --   HGB 14.1 14.6 13.8  HCT 42.6 43.0 39.3  MCV 110.9*  --  106.2*  PLT 351  --  353   Basic Metabolic Panel:  Recent Labs  Lab 12/26/2020 2358 11/30/20 0007  NA 140 141  K 4.2 4.1  CL 105 105  CO2 24  --   GLUCOSE 117* 116*  BUN 11 11  CREATININE 1.05* 0.90  CALCIUM 8.5*  --    Lipid Panel:  Recent Labs  Lab 11/30/20 0317  CHOL 167  TRIG 195*  HDL 57  CHOLHDL 2.9  VLDL 39  LDLCALC 71   HgbA1c:  Recent Labs  Lab 11/30/20 0317  HGBA1C 5.4   Urine Drug Screen: No results for input(s): LABOPIA, COCAINSCRNUR, LABBENZ, AMPHETMU, THCU, LABBARB in the last 168 hours.  Alcohol Level No results for input(s): ETH in the last 168 hours.  IMAGING past 24 hours CT HEAD WO CONTRAST  Result Date: 11/30/2020 CLINICAL DATA:  60 year old female status post fall from bed. Found down. Code stroke presentation. Left ICA and MCA occlusion, left MCA infarct. EXAM: CT HEAD WITHOUT CONTRAST TECHNIQUE: Contiguous axial images were obtained from the base of the skull through the vertex without intravenous contrast. COMPARISON:  0011 hours today FINDINGS: Brain: Patchy and confluent cytotoxic edema scattered throughout the left MCA territory is stable since 0011 hours today. Questionable petechial hemorrhage (series 3, image 17) but no malignant hemorrhagic transformation. Subtle effacement of the  left lateral ventricle. No midline shift. Basilar cisterns remain patent. Stable gray-white matter differentiation elsewhere. No ventriculomegaly. Vascular: Stable noncontrast CT appearance of the vessels. Skull: Stable and intact. Sinuses/Orbits: Visualized paranasal sinuses and mastoids are stable and well aerated. Other: No orbit or scalp soft tissue injury identified. IMPRESSION: 1. Stable relatively large Left MCA infarct since 0011 hours today. Questionable petechial hemorrhage but no malignant hemorrhagic transformation. No significant intracranial mass effect at this time. 2. No new intracranial abnormality. Electronically Signed   By: Odessa Fleming M.D.   On: 11/30/2020 06:08   CT CERVICAL SPINE WO CONTRAST  Result Date: 11/30/2020 CLINICAL DATA:  60 year old female status post fall from bed. Found down. Code stroke presentation. Left ICA and MCA occlusion, left MCA infarct. EXAM: CT CERVICAL SPINE WITHOUT CONTRAST TECHNIQUE: Multidetector CT imaging of the cervical spine was performed without intravenous contrast. Multiplanar CT image reconstructions were also generated. COMPARISON:  Cervical spine CT 0022 hours today. FINDINGS: Alignment: Stable. Mild reversal of cervical lordosis. Cervicothoracic junction alignment is within normal limits. Bilateral posterior element alignment is within normal limits. Skull base and vertebrae: Visualized skull base is intact. No atlanto-occipital dissociation. C1 and C2 appear intact and aligned. No acute osseous abnormality identified in the cervical spine. Soft tissues and  spinal canal: No prevertebral fluid or swelling. No visible canal hematoma. Partially retropharyngeal course of both carotids. Disc levels: Chronic C2-C3 left side facet arthropathy and facet ankylosis. Mild degenerative appearing anterolisthesis of C3 on C4 with chronic facet degeneration greater on the right. Advanced chronic disc and endplate degeneration C4-C5 through C7-T1. At least mild and  possibly moderate associated degenerative cervical spinal stenosis C4 C5 through C6 C7. Upper chest: Healing right clavicle shaft fracture on series 3, image 62. Chronic posterior right 1st rib fracture. Other visible upper thoracic levels appear intact. Negative lung apices. IMPRESSION: 1. No acute traumatic injury identified in the cervical spine. 2. Severe chronic cervical spine degeneration. At least mild and possibly moderate degenerative cervical spinal stenosis. 3. Healing right clavicle shaft fracture. Chronic right 1st rib fracture. Electronically Signed   By: Odessa Fleming M.D.   On: 11/30/2020 06:11   MR BRAIN WO CONTRAST  Result Date: 11/30/2020 CLINICAL DATA:  60 year old female with code stroke presentation, left ICA and MCA occlusion. Left MCA infarct. Neck trauma. No IV tPA or thrombectomy. EXAM: MRI HEAD WITHOUT CONTRAST TECHNIQUE: Multiplanar, multiecho pulse sequences of the brain and surrounding structures were obtained without intravenous contrast. COMPARISON:  CT head and cervical spine, CTA head and neck earlier today. FINDINGS: Brain: Patchy restricted diffusion throughout most of the left MCA territory, including confluent involvement of the lentiform, patchy involvement of the caudate, and involvement of the left mamillary body. Widespread cytotoxic edema. Petechial hemorrhage in the left parietal and posterior temporal lobes as seen on series 12, image 29. No midline shift. Basilar cisterns remain patent. Subtle effacement of the left lateral ventricle. Left ACA and PCA territories relatively spared. No right hemisphere or posterior fossa restricted diffusion. No other intracranial blood products. No ventriculomegaly, extra-axial collection. Outside of the left MCA territory gray and white matter signal appears normal for age. Cervicomedullary junction and pituitary are within normal limits. Vascular: Absent left ICA flow void in the neck and skull base. Other Major intracranial vascular flow  voids are preserved. Skull and upper cervical spine: Cervical spine reported separately. Visualized bone marrow signal is within normal limits. Sinuses/Orbits: Disconjugate gaze.  Otherwise negative. Other: Visible internal auditory structures appear normal. Negative visible scalp and face. IMPRESSION: 1. Large acute infarct affecting most of the Left MCA territory. Widespread cytotoxic edema with petechial hemorrhage in the left parietal and posterior temporal lobes. No malignant hemorrhagic transformation. No significant intracranial mass effect at this time. 2. Absent Left ICA flow void in keeping with the CTA findings today. 3. Cervical Spine MRI reported separately. Electronically Signed   By: Odessa Fleming M.D.   On: 11/30/2020 08:15   MR CERVICAL SPINE WO CONTRAST  Result Date: 11/30/2020 CLINICAL DATA:  60 year old female with code stroke presentation, left ICA and MCA occlusion. Left MCA infarct. Neck trauma. No IV tPA or thrombectomy. EXAM: MRI CERVICAL SPINE WITHOUT CONTRAST TECHNIQUE: Multiplanar, multisequence MR imaging of the cervical spine was performed. No intravenous contrast was administered. COMPARISON:  Cervical spine CTs earlier today. FINDINGS: Study is mildly degraded by motion artifact despite repeated imaging attempts, and no axial GRE images could be obtained. Alignment: Straightening of lordosis with less reversal compared to the CTs day. No significant spondylolisthesis. Vertebrae: Widespread degenerative endplate marrow signal changes in the cervical spine. No convincing No marrow edema or evidence of acute osseous abnormality. Normal background bone marrow signal. Cord: Questionable faint signal abnormality in the cervical spinal cord associated with degenerative stenosis and cord mass effect  at C5-C6 (series 2, image 7). Somewhat diminutive cord volume both above and below that level, with no other cord signal abnormality. Posterior Fossa, vertebral arteries, paraspinal tissues: Abnormal  left ICA flow void in the neck. Other major vascular flow voids in the neck are preserved. Cervicomedullary junction is within normal limits. Brain findings reported separately. Normal prevertebral soft tissues. No paraspinal soft tissue edema or inflammation. Disc levels: C2-C3: Left facet ankylosis and hypertrophy. Moderate left C3 foraminal stenosis. C3-C4: Moderate bilateral facet hypertrophy. Mild disc bulging and endplate spurring mostly affecting the right neural foramen. No spinal stenosis. Mild to moderate left and moderate to severe right C4 foraminal stenosis. C4-C5: Disc space loss. Circumferential disc osteophyte complex eccentric to the right. Effaced ventral CSF space but no significant spinal stenosis. Mild to moderate left and moderate to severe right C5 foraminal stenosis. C5-C6: Disc space loss with bulky circumferential disc osteophyte complex. Broad-based posterior component on series 6, image 26. Mild ligament flavum hypertrophy. Mild to moderate spinal stenosis and spinal cord mass effect. Moderate to severe left greater than right C6 foraminal stenosis. C6-C7: Disc space loss with circumferential disc osteophyte complex. Broad-based posterior component. Mild spinal stenosis. Mild if any cord mass effect. Moderate to severe bilateral C7 foraminal stenosis greater on the right. C7-T1: Disc space loss with circumferential disc osteophyte complex. Broad-based posterior component but no spinal stenosis. Moderate to severe bilateral C8 foraminal stenosis greater on the right. No visible upper thoracic stenosis. IMPRESSION: 1. Mildly motion degraded and truncated exam despite repeated imaging attempts. 2. Advanced cervical spine degeneration with up to Moderate spinal stenosis at C5-C6 with cord mass effect AND questionable abnormal cord signal, such as from developing myelomalacia. 3. No acute traumatic injury identified. Chronic left C2-C3 facet ankylosis. 4. Mild degenerative spinal stenosis at  C6-C7. 5. Widespread moderate or severe degenerative neural foraminal stenosis involving the right C4, right C5, bilateral C6, bilateral C7 and bilateral C8 nerve levels. Electronically Signed   By: Odessa FlemingH  Hall M.D.   On: 11/30/2020 08:22   CT C-SPINE NO CHARGE  Result Date: 11/30/2020 CLINICAL DATA:  Stroke EXAM: CT CERVICAL SPINE WITHOUT CONTRAST TECHNIQUE: Multidetector CT imaging of the cervical spine was performed without intravenous contrast. Multiplanar CT image reconstructions were also generated. COMPARISON:  None. FINDINGS: Alignment: Reversal of normal cervical lordosis. No static subluxation. Skull base and vertebrae: No acute fracture. Soft tissues and spinal canal: Occlusion of the left internal carotid artery. Disc levels: No high-grade spinal canal stenosis. Disc space narrowing and uncovertebral hypertrophy cause moderate-to-severe neural foraminal stenosis at the C4-7 levels. Upper chest: Negative. Other: None. IMPRESSION: 1. No acute fracture or static subluxation of the cervical spine. 2. Occlusion of the left internal carotid artery. 3. Moderate-to-severe neural foraminal stenosis at the C4-7 levels. Electronically Signed   By: Deatra RobinsonKevin  Herman M.D.   On: 11/30/2020 01:25   CT HEAD CODE STROKE WO CONTRAST  Result Date: 11/30/2020 CLINICAL DATA:  Code stroke. Right-sided facial droop and confusion. EXAM: CT HEAD WITHOUT CONTRAST TECHNIQUE: Contiguous axial images were obtained from the base of the skull through the vertex without intravenous contrast. COMPARISON:  None. FINDINGS: Brain: There is no mass, hemorrhage or extra-axial collection. The size and configuration of the ventricles and extra-axial CSF spaces are normal. There is a large subacute infarct within the left MCA territory. However, there is no midline shift or other significant mass effect. No hydrocephalus. Vascular: No abnormal hyperdensity of the major intracranial arteries or dural venous sinuses. No intracranial  atherosclerosis. Skull: The visualized skull base, calvarium and extracranial soft tissues are normal. Sinuses/Orbits: No fluid levels or advanced mucosal thickening of the visualized paranasal sinuses. No mastoid or middle ear effusion. The orbits are normal. ASPECTS [facility] Stroke Program Early CT Score) - Ganglionic level infarction (caudate, lentiform nuclei, internal capsule, insula, M1-M3 cortex): 3 - Supraganglionic infarction (M4-M6 cortex): 1 Total score (0-10 with 10 being normal): 4 IMPRESSION: 1. Large subacute infarct within the left MCA territory without hemorrhage or mass effect. 2. No acute hemorrhage. 3. ASPECTS is 4. 4. These results were communicated to Dr. Erick Blinks at 12:18 am on 11/30/2020 by text page via the [facility] messaging system. Electronically Signed   By: Deatra Robinson M.D.   On: 11/30/2020 00:19   CT ANGIO HEAD CODE STROKE  Result Date: 11/30/2020 CLINICAL DATA:  Left MCA stroke EXAM: CT ANGIOGRAPHY HEAD AND NECK TECHNIQUE: Multidetector CT imaging of the head and neck was performed using the standard protocol during bolus administration of intravenous contrast. Multiplanar CT image reconstructions and MIPs were obtained to evaluate the vascular anatomy. Carotid stenosis measurements (when applicable) are obtained utilizing NASCET criteria, using the distal internal carotid diameter as the denominator. CONTRAST:  75mL OMNIPAQUE IOHEXOL 350 MG/ML SOLN COMPARISON:  None. FINDINGS: CTA NECK FINDINGS SKELETON: There is no bony spinal canal stenosis. No lytic or blastic lesion. OTHER NECK: Normal pharynx, larynx and major salivary glands. No cervical lymphadenopathy. Unremarkable thyroid gland. UPPER CHEST: No pneumothorax or pleural effusion. No nodules or masses. AORTIC ARCH: There is calcific atherosclerosis of the aortic arch. There is no aneurysm, dissection or hemodynamically significant stenosis of the visualized portion of the aorta. Conventional 3 vessel aortic branching  pattern. The visualized proximal subclavian arteries are widely patent. RIGHT CAROTID SYSTEM: Normal without aneurysm, dissection or stenosis. 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 VERTEBRAL ARTERIES: Left dominant configuration. Both origins are clearly patent. There is no dissection, occlusion or flow-limiting stenosis to the skull base (V1-V3 segments). CTA HEAD FINDINGS POSTERIOR CIRCULATION: --Vertebral arteries: Normal V4 segments. --Inferior cerebellar arteries: Normal. --Basilar artery: Normal. --Superior cerebellar arteries: Normal. --Posterior cerebral arteries (PCA): Normal. ANTERIOR CIRCULATION: --Intracranial internal carotid arteries: Left ICA is occluded. Right is normal. --Anterior cerebral arteries (ACA): Normal. Both A1 segments are present. Patent anterior communicating artery (a-comm). 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 Poor collateralization. VENOUS SINUSES: As permitted by contrast timing, patent. ANATOMIC VARIANTS: None Review of the MIP images confirms the above findings. IMPRESSION: 1. 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 2. 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 These results were called by telephone at the time of interpretation on 11/30/2020 at 12:36 am to provider [facility] , who verbally acknowledged these results. Aortic Atherosclerosis (ICD10-I70.0). Electronically Signed   By: Deatra Robinson M.D.   On: 11/30/2020 00:36   CT ANGIO NECK CODE STROKE  Result Date: 11/30/2020 CLINICAL DATA:  Left MCA stroke EXAM: CT ANGIOGRAPHY HEAD AND NECK TECHNIQUE: Multidetector CT imaging of the head and neck was performed using the standard protocol during bolus administration of intravenous contrast. Multiplanar CT image reconstructions and MIPs were obtained to evaluate the  vascular anatomy. Carotid stenosis measurements (when applicable) are obtained utilizing NASCET criteria, using the distal internal carotid diameter as the denominator. CONTRAST:  75mL OMNIPAQUE IOHEXOL 350 MG/ML SOLN COMPARISON:  None. FINDINGS: CTA NECK  FINDINGS SKELETON: There is no bony spinal canal stenosis. No lytic or blastic lesion. OTHER NECK: Normal pharynx, larynx and major salivary glands. No cervical lymphadenopathy. Unremarkable thyroid gland. UPPER CHEST: No pneumothorax or pleural effusion. No nodules or masses. AORTIC ARCH: There is calcific atherosclerosis of the aortic arch. There is no aneurysm, dissection or hemodynamically significant stenosis of the visualized portion of the aorta. Conventional 3 vessel aortic branching pattern. The visualized proximal subclavian arteries are widely patent. RIGHT CAROTID SYSTEM: Normal without aneurysm, dissection or stenosis. 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 VERTEBRAL ARTERIES: Left dominant configuration. Both origins are clearly patent. There is no dissection, occlusion or flow-limiting stenosis to the skull base (V1-V3 segments). CTA HEAD FINDINGS POSTERIOR CIRCULATION: --Vertebral arteries: Normal V4 segments. --Inferior cerebellar arteries: Normal. --Basilar artery: Normal. --Superior cerebellar arteries: Normal. --Posterior cerebral arteries (PCA): Normal. ANTERIOR CIRCULATION: --Intracranial internal carotid arteries: Left ICA is occluded. Right is normal. --Anterior cerebral arteries (ACA): Normal. Both A1 segments are present. Patent anterior communicating artery (a-comm). 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 Poor collateralization. VENOUS SINUSES: As permitted by contrast timing, patent. ANATOMIC VARIANTS: None Review of the MIP images confirms the above findings. IMPRESSION: 1. Complete  occlusion of the left ICA beginning at its origin. 2. 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 These results were called by telephone at the time of interpretation on 11/30/2020 at 12:36 am to provider [facility] , who verbally acknowledged these results. Aortic Atherosclerosis (ICD10-I70.0). Electronically Signed   By: Deatra Robinson M.D.   On: 11/30/2020 00:36    PHYSICAL EXAM General: Unkempt frail middle-aged Caucasian lady chronically ill,  mod distress. Psych: n/a d/t lethergy Eyes: No scleral injection HENT: C-collar in place Head: Normocephalic.  Cardiovascular: Normal rate and regular rhythm.  Respiratory: Effort normal and breath sounds normal to anterior ascultation GI: Soft. Mild distension. There is no apparent tenderness.  Skin: multiple lesions, scabs, abrasions and bruising over all limbs and buttock.  Multiple scattered papular small circumscribed lesions all over extremities and body.   Neurological Examination Mental Status: Lethargic. Alerts to repeat tactile/voice stimuli, but does not attend nor follow commands.  Globally aphasic and mute. Cranial Nerves: right facial weakness. PERRL. Some left gaze deviation with decreased blink. Unable to test EOM, shrugs or tongue d/t no cooperation.  Motor: decreased bulk, flaccid right hemiparesis. Withdraws to pain appropriately on left, decreased on right.  Purposeful antigravity movements on the left side. Plantars: Right: mute   Left: downgoing Cerebellar: unable Gait: unable  ASSESSMENT/PLAN Ms. ZO LOUDON is a 60 y.o. female with history of anxiety, depression, EtOh abuse who presents as a stroke code with R hemiplegia and aphasia. Found to have a large L MCA stroke with ASPECTS of 4. CT angio with L ICA occlusion at the origin and L MCA occlusion. Her Acomm is patent and she has good BL A1. Her L PCA originates from the basilar with no significant narrowing in the posterior circulation.  She was outside the tPA window, not a candidate for thrombectomy due to ASPECTs score of 4 and what appears to be a completed L MCA stroke.   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  With mild cytotoxic edema and slight left right midline shift Code Stroke CT head LMCA stroke Small vessel  disease. Atrophy. ASPECTS 4.    CTA head & neck Complete occlusion of LICA MRI  Large acute LMCA with widespread edema and some petechial hemorrhagic conversion.  MR C-spine: ?abnormal cord signal; mod spinal stenosis C5-6. No acute traumatic injury, but remains in C-collar. Will ask ER or CCM to clear her. 2D Echo pending LDL 71 HgbA1c 5.4 VTE prophylaxis - lovenox    There are no active orders of the following types: Diet, Nourishments.   none  prior to admission, now on aspirin 300 mg suppository daily.  Therapy recommendations:  pending Disposition:  pending  Hypertension Home meds:  none Stable Permissive hypertension (OK if < 220/120) but gradually normalize in 5-7 days Long-term BP goal normotensive  Hyperlipidemia Home meds:  none LDL 71, goal < 70 High intensity statin not indicated Continue statin at discharge  Diabetes type II no dx HgbA1c 5.4, goal < 7.0 CBGs Recent Labs    11/30/20 0006  GLUCAP 124*    SSI  Other Stroke Risk Factors Cigarette smoker; advised to stop smoking Obesity, Body mass index is 31.18 kg/m., BMI >/= 30 associated with increased stroke risk, recommend weight loss, diet and exercise as appropriate  ETOH abuse  Other Active Problems Anxiety/depression ETOH abuse- Thiamine and watch for withdrawals Clinically significant cerebral edema- ask CCM to consult and 3% NaCl has been started  Hospital day # 0  Desiree Metzger-Cihelka, ARNP-C, ANVP-BC Pager: [phone]  I have personally obtained history,examined this patient, reviewed notes, independently viewed imaging studies, participated in  medical decision making and plan of care.ROS completed by me personally and pertinent positives fully documented  I have made any additions or clarifications directly to the above note. Agree with note above.  Patient has presented with unknown time of onset with a large left hemispheric infarct due to left carotid occlusion etiology not clear possibly underlying dissection versus atherosclerosis.  Neurological exam shows significant global aphasia and slight left gaze deviation with dense right hemiplegia.  MRI also shows cytotoxic edema and early midline shift hence recommended admission to the intensive care unit for close neurological monitoring for worsening edema and start hypertonic saline with serum sodium goal 150-155.  No family available at the bedside for discussion.  Check urine drug screen discussed with Dr. Merrily Pew critical care medicine Dr. Orma Flaming teaching service. This patient is critically ill and at significant risk of neurological worsening, death and care requires constant monitoring of vital signs, hemodynamics,respiratory and cardiac monitoring, extensive review of multiple databases, frequent neurological assessment, discussion with family, other specialists and medical decision making of high complexity.I have made any additions or clarifications directly to the above note.This critical care time does not reflect procedure time, or teaching time or supervisory time of PA/NP/Med Resident etc but could involve care discussion time.  I spent 40 minutes of neurocritical care time  in the care of  this patient.      Delia Heady, MD Medical Director [facility] Stroke Center Pager: [phone] 11/30/2020 3:03 PM   Delia Heady, MD Medical Director [facility] Stroke Center Pager: [phone] 11/30/2020 3:02 PM  To contact Stroke Continuity provider, please refer to [email or web address]. After hours, contact General Neurology

## 2020-11-30 NOTE — ED Notes (Signed)
Patient transported to CT 

## 2020-11-30 NOTE — ED Notes (Signed)
Pt repeatedly pulling at ccollar, monitor leads, tremulous

## 2020-11-30 NOTE — Progress Notes (Signed)
Patient arrived to unit with belongings including clothing only.

## 2020-11-30 NOTE — ED Notes (Signed)
Lafonda Mosses sister [phone] requesting an update

## 2020-11-30 NOTE — ED Notes (Signed)
Back from MRI, no changes, per report/chart.
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
## 2020-11-30 NOTE — ED Notes (Signed)
Pt found down on ground by nurse at 0425. Pt found prone next to bed. Primary nurse immediatly made aware and went to pt to assess, C-spine held by NT and MD Pollina notified immediately after pt assessed. C-collar was placed on pt, pt was log rolled and placed on long board then moved back to bed.
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
## 2020-11-30 NOTE — Progress Notes (Signed)
SLP Cancellation Note  Patient Details Name: Rebecca Wilson MRN: [id number] DOB: 11-13-1960   Cancelled treatment:       Reason Eval/Treat Not Completed: Patient's level of consciousness- Will follow for readiness for swallow and speech evaluations. Spoke with RN.   [name], MA CCC/SLP Acute Rehabilitation Services Office number [phone] Pager [phone]    [name] 11/30/2020, 9:26 AM

## 2020-11-30 NOTE — ED Notes (Signed)
Pt sleeping at this time.

## 2020-11-30 NOTE — ED Notes (Signed)
Patient transported to MRI 

## 2020-11-30 NOTE — ED Provider Notes (Signed)
Called to the room by nursing staff because patient had fallen out of bed.  Encountered patient lying on the floor next to the bed.  Patient lying facedown.  She is tremulous and still nonverbal.  No lacerations or bleeding.  Patient logrolled, maintaining spinal immobilization and placed in a c-collar.  Placed back in bed.  Will obtain CT head and cervical spine.  Initiate CIWA protocol.   Gilda Crease, MD 11/30/20 [phone]

## 2020-11-30 NOTE — ED Triage Notes (Addendum)
Pt arrived via [facility] for cc of Code Stroke from home.  Pt found on floor of bedroom, by friends at 1300 today. Story provided by friends on scene inconsistent, but best timeline for last seen normal is Saturday at 18:00.  Per EMS pt unable to open left eye, right sided facial droop, right side extremity flaccidity, nonverbal

## 2020-11-30 NOTE — Progress Notes (Signed)
PT Cancellation Note  Patient Details Name: CHARMA MOCARSKI MRN: [id number] DOB: 1960-08-15   Cancelled Treatment:    Reason Eval/Treat Not Completed: Active bedrest order   Bevelyn Buckles 11/30/2020, 2:02 PM Camya Haydon M,PT Acute Rehab Services [phone] (pager)

## 2020-11-30 NOTE — ED Notes (Signed)
Critical care at [facility]. Family x2 at [facility]. Updated.

## 2020-11-30 NOTE — Hospital Course (Signed)
60 year old female presenting with large left MCA stroke  Left MCA territory infarct. LKN >24h prior to admission so not a candidate for tPA or thormbectomy.  Deficits include right hemiplegia Plan  -will likely need cortrak placed later this week  AUD -thiamine/folate

## 2020-11-30 NOTE — ED Notes (Addendum)
Assessment of patient does not show any new injury to skin/body or change in neuro status. Pt still mute, GCS 10, A&Ox0, and does not indicate that she understands verbal communication. Pt only has movement in left extremities, at this time left arm appears to grab at air in front of her and repeatedly reaching to touch face as pt did before fall.

## 2020-11-30 NOTE — ED Notes (Addendum)
Admitting service notified and charge nurse notified

## 2020-11-30 NOTE — Consult Note (Signed)
NAME:  Rebecca Wilson, MRN:  [id number], DOB:  11-Nov-1960, LOS: 0 ADMISSION DATE:  12/25/2020, CONSULTATION DATE: 11/30/2020 REFERRING MD:  Reymundo Poll, MD, CHIEF COMPLAINT: Found down  History of Present Illness:  Patient is aphasic most of the history is obtained by chart review  60 year old female with alcohol abuse, anxiety and bipolar disorder who was brought into the emergency department as a stroke code after she was found down.  Emergency department patient was noted to have right-sided weakness and right facial paralysis, stroke code was called, patient underwent CT head which showed large MCA/ACA territory stroke with cerebral edema.  Per neurology patient was not candidate for tPA or thrombectomy.  Due to malignant cerebral edema, PCCM was consulted to help with management.  Pertinent  Medical History   Past Medical History:  Diagnosis Date   Anxiety    Depression     Significant Hospital Events: Including procedures, antibiotic start and stop dates in addition to other pertinent events   Admitted to ICU  Interim History / Subjective:    Objective   Blood pressure (!) 168/86, pulse 100, resp. rate 16, height 5\' 5"  (1.651 m), weight 85 kg, SpO2 (!) 86 %.       No intake or output data in the 24 hours ending 11/30/20 1211 Filed Weights   11/30/20 0210  Weight: 85 kg    Examination: Physical exam: General: Crtitically ill-appearing female, lying on the bed HEENT: [place]/AT, eyes anicteric.  Neck collar on Neuro: Eyes open, not following commands, aphasic, right upper extremity flaccid, right lower extremity triple flexion, antigravity on left side.  Right facial droop  Chest: Tender to touch on left side, coarse breath sounds, no wheezes or rhonchi Heart: Regular rate and rhythm, no murmurs or gallops Abdomen: Soft, nontender, nondistended, bowel sounds present Skin: No rash, multiple bruise marks all over the body  Resolved Hospital Problem list     Assessment &  Plan:  Acute left MCA/ACA territory ischemic stroke Cerebral edema with minimal midline shift Alcohol abuse Bipolar disorder  Continue neuro watch every hour Admit to ICU  Stroke team is following Continue secondary stroke prophylaxis Continue aspirin and atorvastatin MRI brain confirmed left MCA and ACA territory stroke Patient was not candidate for tPA or thrombectomy She does show cerebral edema, will start on 3% saline via peripheral line Monitor serum sodium every 6 hours with goal 155-160 Continue CIWA protocol Continue thiamine and folate Holding antipsychotic meds for now  Best Practice (right click and "Reselect all SmartList Selections" daily)   Diet/type: NPO DVT prophylaxis: LMWH GI prophylaxis: N/A Lines: N/A Foley:  N/A Code Status:  DNR Last date of multidisciplinary goals of care discussion [7/4: Spoke with patient's son and daughter at bedside, explained and updated about patient's current condition.  They would like to keep patient DNR and continue full aggressive care, if patient requires endotracheal intubation for cerebral edema and unable to protect her airway, they would like to proceed with it]  Labs   CBC: Recent Labs  Lab 12/01/2020 2358 11/30/20 0007 11/30/20 0317  WBC 13.6*  --  13.1*  NEUTROABS 11.7*  --   --   HGB 14.1 14.6 13.8  HCT 42.6 43.0 39.3  MCV 110.9*  --  106.2*  PLT 351  --  353    Basic Metabolic Panel: Recent Labs  Lab 12/06/2020 2358 11/30/20 0007  NA 140 141  K 4.2 4.1  CL 105 105  CO2 24  --  GLUCOSE 117* 116*  BUN 11 11  CREATININE 1.05* 0.90  CALCIUM 8.5*  --    GFR: Estimated Creatinine Clearance: 72.5 mL/min (by C-G formula based on SCr of 0.9 mg/dL). Recent Labs  Lab 11/30/2020 2358 11/30/20 0317  WBC 13.6* 13.1*    Liver Function Tests: Recent Labs  Lab 12/20/2020 2358  AST 31  ALT 20  ALKPHOS 101  BILITOT 2.3*  PROT 6.9  ALBUMIN 3.0*   No results for input(s): LIPASE, AMYLASE in the last 168  hours. No results for input(s): AMMONIA in the last 168 hours.  ABG    Component Value Date/Time   TCO2 26 11/30/2020 0007     Coagulation Profile: Recent Labs  Lab 12/26/2020 2358  INR 1.1    Cardiac Enzymes: No results for input(s): CKTOTAL, CKMB, CKMBINDEX, TROPONINI in the last 168 hours.  HbA1C: Hgb A1c MFr Bld  Date/Time Value Ref Range Status  11/30/2020 03:17 AM 5.4 4.8 - 5.6 % Final    Comment:    (NOTE) Pre diabetes:          5.7%-6.4%  Diabetes:              >6.4%  Glycemic control for   <7.0% adults with diabetes     CBG: Recent Labs  Lab 11/30/20 0006  GLUCAP 124*    Review of Systems:   Unable to obtain due to aphasia  Past Medical History:  She,  has a past medical history of Anxiety and Depression.   Surgical History:   Past Surgical History:  Procedure Laterality Date   ABDOMINAL HYSTERECTOMY     CESAREAN SECTION     x4   CHOLECYSTECTOMY     KYPHOPLASTY N/A 08/27/2020   Procedure: KYPHOPLASTY T12;  Surgeon: Venita Lick, MD;  Location: [facility] OR;  Service: Orthopedics;  Laterality: N/A;   PLANTAR FASCIA SURGERY Bilateral      Social History:   reports that she has been smoking. She has been smoking an average of 1.50 packs per day. She has never used smokeless tobacco. She reports previous alcohol use. She reports that she does not use drugs.   Family History:  Her family history is not on file.   Allergies No Known Allergies   Home Medications  Prior to Admission medications   Medication Sig Start Date End Date Taking? Authorizing Provider  ALPRAZolam Prudy Feeler) 0.5 MG tablet Take 0.5 mg by mouth 3 (three) times daily as needed for anxiety.    [provider]  ANORO ELLIPTA 62.5-25 MCG/INH AEPB Inhale 1 puff into the lungs daily as needed (respiratory issues). 05/14/20   [provider]  augmented betamethasone dipropionate (DIPROLENE-AF) 0.05 % cream Apply 1 application topically 2 (two) times daily as needed  (psoriasis). 08/09/20   [provider]  clobetasol (TEMOVATE) 0.05 % external solution Apply 1 application topically daily as needed (psoriasis). 07/09/20   [provider]  clotrimazole-betamethasone (LOTRISONE) cream Apply 1 application topically 2 (two) times daily as needed (corners of mouth (cracking)).    [provider]  fluticasone (CUTIVATE) 0.05 % cream Apply 1 application topically 2 (two) times daily as needed (psoriasis). 07/08/20   [provider]  methocarbamol (ROBAXIN) 500 MG tablet Take 500 mg by mouth every 6 (six) hours as needed for muscle spasms. 08/05/20   [provider]  mirtazapine (REMERON) 15 MG tablet Take 15 mg by mouth at bedtime.    [provider]  ondansetron (ZOFRAN) 4 MG tablet Take  4 mg by mouth in the morning and at bedtime.    [provider]  ondansetron (ZOFRAN) 4 MG tablet Take 1 tablet (4 mg total) by mouth every 8 (eight) hours as needed for nausea or vomiting. 08/27/20 08/27/21  Rhodia Albright, PA-C  traZODone (DESYREL) 100 MG tablet Take 200 mg by mouth at bedtime.    [provider]  vortioxetine HBr (TRINTELLIX) 10 MG TABS tablet Take 10 mg by mouth in the morning and at bedtime.    [provider]     Total critical care time: 44 minutes  Performed by: Cheri Fowler   Critical care time was exclusive of separately billable procedures and treating other patients.   Critical care was necessary to treat or prevent imminent or life-threatening deterioration.   Critical care was time spent personally by me on the following activities: development of treatment plan with patient and/or surrogate as well as nursing, discussions with consultants, evaluation of patient's response to treatment, examination of patient, obtaining history from patient or surrogate, ordering and performing treatments and interventions, ordering and review of laboratory studies, ordering and review of  radiographic studies, pulse oximetry and re-evaluation of patient's condition.   Cheri Fowler MD [place] See Amion for pager If no response to pager, please call [phone] until 7pm After 7pm, Please call E-link [phone]

## 2020-11-30 NOTE — Progress Notes (Signed)
OT Cancellation Note  Patient Details Name: Rebecca Wilson MRN: [id number] DOB: 06/18/1960   Cancelled Treatment:    Reason Eval/Treat Not Completed: Patient at procedure or test/ unavailable (MRI. Will return as schedule allows.)  Franklin Baumbach M Mattia Liford Christell Steinmiller MSOT, OTR/L Acute Rehab Pager: [phone] Office: [phone] 11/30/2020, 7:43 AM

## 2020-11-30 NOTE — ED Provider Notes (Signed)
[facility] EMERGENCY DEPARTMENT Provider Note   CSN: [id number] Arrival date & time: 12/03/2020  2347     History Chief Complaint  Patient presents with   Code Stroke    Rebecca Wilson is a 60 y.o. female.  Patient brought to the emergency department by ambulance as a code stroke.  Patient with right-sided facial droop, right-sided hemiparesis and inability to speak.  Last known normal is not clear at this time, likely was sometime yesterday.  Level 5 caveat due to acuity of patient.      Past Medical History:  Diagnosis Date   Anxiety    Depression     Patient Active Problem List   Diagnosis Date Noted   MDD (major depressive disorder), recurrent episode, severe (HCC) 05/15/2017    Past Surgical History:  Procedure Laterality Date   ABDOMINAL HYSTERECTOMY     CESAREAN SECTION     x4   CHOLECYSTECTOMY     KYPHOPLASTY N/A 08/27/2020   Procedure: KYPHOPLASTY T12;  Surgeon: Venita Lick, MD;  Location: [facility] OR;  Service: Orthopedics;  Laterality: N/A;   PLANTAR FASCIA SURGERY Bilateral      OB History   No obstetric history on file.     No family history on file.  Social History   Tobacco Use   Smoking status: Every Day    Packs/day: 1.50    Pack years: 0.00    Types: Cigarettes   Smokeless tobacco: Never  Vaping Use   Vaping Use: Never used  Substance Use Topics   Alcohol use: Not Currently    Comment: beer 6-8/night   Drug use: Never    Home Medications Prior to Admission medications   Medication Sig Start Date End Date Taking? Authorizing Provider  ALPRAZolam Prudy Feeler) 0.5 MG tablet Take 0.5 mg by mouth 3 (three) times daily as needed for anxiety.    [provider]  ANORO ELLIPTA 62.5-25 MCG/INH AEPB Inhale 1 puff into the lungs daily as needed (respiratory issues). 05/14/20   [provider]  augmented betamethasone dipropionate (DIPROLENE-AF) 0.05 % cream Apply 1 application topically 2 (two) times daily as needed  (psoriasis). 08/09/20   [provider]  clobetasol (TEMOVATE) 0.05 % external solution Apply 1 application topically daily as needed (psoriasis). 07/09/20   [provider]  clotrimazole-betamethasone (LOTRISONE) cream Apply 1 application topically 2 (two) times daily as needed (corners of mouth (cracking)).    [provider]  fluticasone (CUTIVATE) 0.05 % cream Apply 1 application topically 2 (two) times daily as needed (psoriasis). 07/08/20   [provider]  methocarbamol (ROBAXIN) 500 MG tablet Take 500 mg by mouth every 6 (six) hours as needed for muscle spasms. 08/05/20   [provider]  mirtazapine (REMERON) 15 MG tablet Take 15 mg by mouth at bedtime.    [provider]  ondansetron (ZOFRAN) 4 MG tablet Take 4 mg by mouth in the morning and at bedtime.    [provider]  ondansetron (ZOFRAN) 4 MG tablet Take 1 tablet (4 mg total) by mouth every 8 (eight) hours as needed for nausea or vomiting. 08/27/20 08/27/21  Rhodia Albright, PA-C  traZODone (DESYREL) 100 MG tablet Take 200 mg by mouth at bedtime.    [provider]  vortioxetine HBr (TRINTELLIX) 10 MG TABS tablet Take 10 mg by mouth in the morning and at bedtime.    [provider]    Allergies    Patient has no known allergies.  Review of Systems   Review of Systems  Unable to perform ROS: Acuity of condition   Physical Exam Updated Vital Signs BP (!) 161/88 (BP Location: Right Arm)   Pulse 90   Resp 15   SpO2 97%   Physical Exam Vitals and nursing note reviewed.  Constitutional:      Appearance: She is well-developed.  HENT:     Head: Normocephalic and atraumatic.     Right Ear: Hearing normal.     Left Ear: Hearing normal.     Nose: Nose normal.  Eyes:     Conjunctiva/sclera: Conjunctivae normal.     Pupils: Pupils are equal, round, and reactive to light.  Cardiovascular:     Rate and Rhythm: Regular rhythm.     Heart sounds: S1 normal  and S2 normal. No murmur heard.   No friction rub. No gallop.  Pulmonary:     Effort: Pulmonary effort is normal. No respiratory distress.     Breath sounds: Normal breath sounds.  Chest:     Chest wall: No tenderness.  Abdominal:     General: Bowel sounds are normal.     Palpations: Abdomen is soft.     Tenderness: There is no abdominal tenderness. There is no guarding or rebound. Negative signs include Murphy's sign and McBurney's sign.     Hernia: No hernia is present.  Musculoskeletal:        General: No deformity.     Cervical back: Normal range of motion and neck supple.  Skin:    General: Skin is warm and dry.     Findings: No rash.  Neurological:     Mental Status: She is alert.     GCS: GCS eye subscore is 4. GCS verbal subscore is 2. GCS motor subscore is 5.     Cranial Nerves: Cranial nerve deficit and facial asymmetry present.     Sensory: Sensory deficit present.     Motor: Weakness (R side) present.     Coordination: Coordination normal.    ED Results / Procedures / Treatments   Labs (all labs ordered are listed, but only abnormal results are displayed) Labs Reviewed  I-STAT CHEM 8, ED - Abnormal; Notable for the following components:      Result Value   Glucose, Bld 116 (*)    Calcium, Ion 1.00 (*)    All other components within normal limits  CBG MONITORING, ED - Abnormal; Notable for the following components:   Glucose-Capillary 124 (*)    All other components within normal limits  PROTIME-INR  APTT  CBC  DIFFERENTIAL  COMPREHENSIVE METABOLIC PANEL  I-STAT BETA HCG BLOOD, ED ([facility], [facility], [facility] ONLY)    EKG None  Radiology No results found.  Procedures Procedures   Medications Ordered in ED Medications  sodium chloride flush (NS) 0.9 % injection 3 mL (has no administration in time range)  iohexol (OMNIPAQUE) 350 MG/ML injection 75 mL (has no administration in time range)    ED Course  I have reviewed the triage vital signs and the nursing  notes.  Pertinent labs & imaging results that were available during my care of the patient were reviewed by me and considered in my medical decision making (see chart for details).    MDM Rules/Calculators/A&P                          Patient presented by EMS from home as a code stroke.  Last known  normal unclear at this time, likely was around 6 PM yesterday.  At that time patient was noted to go into her room with alcohol that she had purchased.  Sometime tonight she was found on the floor in her room.  Examination concerning for large vessel occlusion at arrival.  She cannot cross midline with her eyes, essentially flaccid on the right side.  Timeline suggest that she is out of the window for both tPA and IR.  CT scan shows large area stroke.  No intervention per neurology.  Admit for further work-up.  CRITICAL CARE Performed by: Gilda Crease   Total critical care time: 35 minutes  Critical care time was exclusive of separately billable procedures and treating other patients.  Critical care was necessary to treat or prevent imminent or life-threatening deterioration.  Critical care was time spent personally by me on the following activities: development of treatment plan with patient and/or surrogate as well as nursing, discussions with consultants, evaluation of patient's response to treatment, examination of patient, obtaining history from patient or surrogate, ordering and performing treatments and interventions, ordering and review of laboratory studies, ordering and review of radiographic studies, pulse oximetry and re-evaluation of patient's condition.  Final Clinical Impression(s) / ED Diagnoses Final diagnoses:  Stroke (cerebrum) [facility])    Rx / DC Orders ED Discharge Orders     None        Jahvier Aldea, Canary Brim, MD 11/30/20 [phone]

## 2020-11-30 NOTE — ED Notes (Signed)
Not in room. Pt in MRI.

## 2020-11-30 NOTE — Progress Notes (Addendum)
Internal Medicine Attending Note:  Patient is a 60 yo F with a pmhx of alcohol use disorder, COPD, psoriasis, depression, and bipolar disorder here with a large left MCA infarction and significant neurological deficits including complete aphasia, flaccid right hemiparesis, and non reactive left pupil. Unknown last known normal. She presented after being found by her roommate. Last seen normal two days ago after returning from the liquor store on 7/2. Yesterday, roommate found patient on the floor around 1 pm, presumed she was drunk, and left for the day. When she returned later that evening patient was still on the floor and called EMS. CT head on arrival showed a large subacute left MCA infarct with ASPECTS score of 4. CTA with complete occlusion of the left ICA and left MCA with poor collaterals.    Seen this morning after return from MRI. Patient is wearing a C collar. She opens her eyes to sternal rub but does not follow commands.   Physical Exam Constitutional: Wearing a C collar, unresponsive to voice Cardiovascular: RRR, no murmurs, rubs, or gallops.  Pulmonary/Chest: clear to ascultation anteriorly  Abdominal: Soft, non tender, non distended. +BS.  Neurologic exam: - Mental status: Responsive to touch and painful stimuli  - Cranial Nerves: Exam limited due to C collar and patient being unable to follow commands - Motor: Strength Right upper and lower flaccid paralysis with decreased done and 0/5 strength, no spontaneous movement. Absent plantar reflex on the right. Moving left upper extremity purposefully to push me away and pull at C collar. Moving left lower extremity spontaneously.  Skin: Diffuse psoriasis plaques over her entire body and scalp  Psychiatric: Normal mood and affect   MRI Brain and Cervical Spine Wo: IMPRESSION: 1. Mildly motion degraded and truncated exam despite repeated imaging attempts. 2. Advanced cervical spine degeneration with up to Moderate spinal stenosis at  C5-C6 with cord mass effect AND questionable abnormal cord signal, such as from developing myelomalacia. 3. No acute traumatic injury identified. Chronic left C2-C3 facet ankylosis. 4. Mild degenerative spinal stenosis at C6-C7. 5. Widespread moderate or severe degenerative neural foraminal stenosis involving the right C4, right C5, bilateral C6, bilateral C7 and bilateral C8 nerve levels.  Assessment & Plan:   #Large left MCA infarct with wide cytotoxic edema and petechial hemorrhage #Complete occlusion of left ICA and MCA - Patient with prolonged down time, very poor prognosis, and high risk for hemorrhagic conversion  - Seen by neurology in the ED; not a candidate for intervention  - Dr. Barbaraann Faster spoke with Dr. Pearlean Brownie regarding exam findings this morning and MRI results. Recommend transfer to the ICU. PCCM has been consulted, appreciate assistance   - Stroke work up has been initiated; echo pending, on telemetry, permissive HTN - Needs GOC   #Alcohol use disorder: - CIWA with ativan - telemetry    Reymundo Poll, MD 11/30/2020, 8:38 AM

## 2020-12-01 ENCOUNTER — Inpatient Hospital Stay (HOSPITAL_COMMUNITY): Payer: Medicare Other

## 2020-12-01 ENCOUNTER — Inpatient Hospital Stay: Payer: Self-pay

## 2020-12-01 DIAGNOSIS — I63512 Cerebral infarction due to unspecified occlusion or stenosis of left middle cerebral artery: Secondary | ICD-10-CM

## 2020-12-01 DIAGNOSIS — I6522 Occlusion and stenosis of left carotid artery: Secondary | ICD-10-CM

## 2020-12-01 DIAGNOSIS — S42001A Fracture of unspecified part of right clavicle, initial encounter for closed fracture: Secondary | ICD-10-CM | POA: Diagnosis not present

## 2020-12-01 LAB — ECHOCARDIOGRAM COMPLETE
AR max vel: 2.37 cm2
AV Area VTI: 2.59 cm2
AV Area mean vel: 2.24 cm2
AV Mean grad: 4 mmHg
AV Peak grad: 6.5 mmHg
Ao pk vel: 1.27 m/s
Area-P 1/2: 3.03 cm2
Height: 65 in
MV VTI: 2.54 cm2
S' Lateral: 3 cm
Weight: 2998.26 oz

## 2020-12-01 LAB — SODIUM
Sodium: 149 mmol/L — ABNORMAL HIGH (ref 135–145)
Sodium: 151 mmol/L — ABNORMAL HIGH (ref 135–145)
Sodium: 154 mmol/L — ABNORMAL HIGH (ref 135–145)
Sodium: 157 mmol/L — ABNORMAL HIGH (ref 135–145)

## 2020-12-01 LAB — PHOSPHORUS: Phosphorus: 3 mg/dL (ref 2.5–4.6)

## 2020-12-01 LAB — GLUCOSE, CAPILLARY
Glucose-Capillary: 128 mg/dL — ABNORMAL HIGH (ref 70–99)
Glucose-Capillary: 142 mg/dL — ABNORMAL HIGH (ref 70–99)
Glucose-Capillary: 97 mg/dL (ref 70–99)

## 2020-12-01 LAB — MAGNESIUM: Magnesium: 3.3 mg/dL — ABNORMAL HIGH (ref 1.7–2.4)

## 2020-12-01 MED ORDER — SODIUM CHLORIDE 0.9 % IV SOLN: INTRAVENOUS | Status: DC | PRN

## 2020-12-01 MED ORDER — SODIUM CHLORIDE 0.9% FLUSH
10.0000 mL | Freq: Two times a day (BID) | INTRAVENOUS | Status: DC
  Administered 2020-12-01 – 2020-12-03 (×5): 10 mL
  Administered 2020-12-04: 20 mL

## 2020-12-01 MED ORDER — MAGNESIUM SULFATE 4 GM/100ML IV SOLN
4.0000 g | Freq: Once | INTRAVENOUS | Status: AC
  Administered 2020-12-01: 4 g via INTRAVENOUS
  Filled 2020-12-01: qty 100

## 2020-12-01 MED ORDER — SODIUM CHLORIDE 0.9% FLUSH
10.0000 mL | INTRAVENOUS | Status: DC | PRN
Start: 2020-12-01 — End: 2020-12-04

## 2020-12-01 MED ORDER — ADULT MULTIVITAMIN W/MINERALS CH
1.0000 | ORAL_TABLET | Freq: Every day | ORAL | Status: DC
  Administered 2020-12-01 – 2020-12-03 (×3): 1
  Filled 2020-12-01 (×3): qty 1

## 2020-12-01 MED ORDER — PROSOURCE TF PO LIQD
45.0000 mL | Freq: Two times a day (BID) | ORAL | Status: DC
  Administered 2020-12-01 – 2020-12-03 (×4): 45 mL
  Filled 2020-12-01 (×3): qty 45

## 2020-12-01 MED ORDER — HYDROCORTISONE 1 % EX CREA
TOPICAL_CREAM | Freq: Two times a day (BID) | CUTANEOUS | Status: DC | PRN
  Filled 2020-12-01: qty 28

## 2020-12-01 MED ORDER — OSMOLITE 1.5 CAL PO LIQD
1000.0000 mL | ORAL | Status: DC
  Administered 2020-12-01 – 2020-12-03 (×2): 1000 mL

## 2020-12-01 NOTE — Procedures (Signed)
Cortrak  Person Inserting Tube:  Denys Labree, Verdon Cummins, RD Tube Type:  Cortrak - 43 inches Tube Size:  10 Tube Location:  Right nare Initial Placement:  Stomach Secured by: Bridle Technique Used to Measure Tube Placement:  Marking at nare/corner of mouth  Cortrak Tube Team Note:  Consult received to place a Cortrak feeding tube.   X-ray is required, abdominal x-ray has been ordered by the Cortrak team. Please confirm tube placement before using the Cortrak tube.   If the tube becomes dislodged please keep the tube and contact the Cortrak team at [email or web address] ([email or web address]) for replacement.  If after hours and replacement cannot be delayed, place a NG tube and confirm placement with an abdominal x-ray.    Eugene Gavia, MS, RD, LDN (she/her/hers) RD pager number and weekend/on-call pager number located in Amion.

## 2020-12-01 NOTE — Progress Notes (Signed)
STROKE TEAM PROGRESS NOTE   INTERVAL HISTORY Her son and daughter at the bedside. C-collar still in place d/t fall. She is l more alert and interactive today and is attempting to speak and does follow simple midline and phentermine commands..  Remains globally aphasic with dense right hemiplegia and right field cut blood pressure adequately controlled.  She is on hypertonic saline and serum sodium is at goal at 151 today.  Her peripheral IV line has infiltrated this morning and plans are to get a PICC line placed Follow-up CT scan from this morning shows increasing cytotoxic edema in the left MCA infarct with 2 mm left-to-right midline shift. Vitals:   12/01/20 1300 12/01/20 1400 12/01/20 1500 12/01/20 1600  BP: (!) 141/70 128/77 (!) 131/91 (!) 161/74  Pulse: 78 73 84 84  Resp: 12 14 13 17   Temp:      TempSrc:      SpO2: 95% 94% 98% 96%  Weight:      Height:       CBC:  Recent Labs  Lab 12/17/2020 2358 11/30/20 0007 11/30/20 0317  WBC 13.6*  --  13.1*  NEUTROABS 11.7*  --   --   HGB 14.1 14.6 13.8  HCT 42.6 43.0 39.3  MCV 110.9*  --  106.2*  PLT 351  --  353   Basic Metabolic Panel:  Recent Labs  Lab 12/02/2020 2358 11/30/20 0007 11/30/20 1337 11/30/20 1812 12/01/20 0619 12/01/20 1239  NA 140 141 143   < > 151* 154*  K 4.2 4.1  --   --   --   --   CL 105 105  --   --   --   --   CO2 24  --   --   --   --   --   GLUCOSE 117* 116*  --   --   --   --   BUN 11 11  --   --   --   --   CREATININE 1.05* 0.90  --   --   --   --   CALCIUM 8.5*  --   --   --   --   --   MG  --   --  1.5*  --   --   --   PHOS  --   --  3.2  --   --   --    < > = values in this interval not displayed.   Lipid Panel:  Recent Labs  Lab 11/30/20 0317  CHOL 167  TRIG 195*  HDL 57  CHOLHDL 2.9  VLDL 39  LDLCALC 71   HgbA1c:  Recent Labs  Lab 11/30/20 0317  HGBA1C 5.4   Urine Drug Screen:  Recent Labs  Lab 11/30/20 1723  LABOPIA NONE DETECTED  COCAINSCRNUR NONE DETECTED  LABBENZ  POSITIVE*  AMPHETMU NONE DETECTED  THCU NONE DETECTED  LABBARB NONE DETECTED    Alcohol Level No results for input(s): ETH in the last 168 hours.  IMAGING past 24 hours CT Head Wo Contrast  Result Date: 12/01/2020 CLINICAL DATA:  Stroke follow-up EXAM: CT HEAD WITHOUT CONTRAST TECHNIQUE: Contiguous axial images were obtained from the base of the skull through the vertex without intravenous contrast. COMPARISON:  11/30/2020 FINDINGS: Brain: Slightly increased cytotoxic edema within the left MCA territory. There is approximately 2 mm of rightward midline shift. No acute hemorrhage. Vascular: No abnormal hyperdensity of the major intracranial arteries or dural venous sinuses. No intracranial atherosclerosis.  Skull: The visualized skull base, calvarium and extracranial soft tissues are normal. Sinuses/Orbits: No fluid levels or advanced mucosal thickening of the visualized paranasal sinuses. No mastoid or middle ear effusion. The orbits are normal. IMPRESSION: 1. Slightly increased cytotoxic edema within the left MCA territory with 2 mm of rightward midline shift. 2. No acute hemorrhage. Electronically Signed   By: Deatra Robinson M.D.   On: 12/01/2020 02:47   ECHOCARDIOGRAM COMPLETE  Result Date: 12/01/2020    ECHOCARDIOGRAM REPORT   Patient Name:   Rebecca Wilson Date of Exam: 12/01/2020 Medical Rec #:  [id number]     Height:       65.0 in Accession #:    [id number]    Weight:       187.4 lb Date of Birth:  March 26, 1961     BSA:          1.924 m Patient Age:    59 years      BP:           151/77 mmHg Patient Gender: F             HR:           76 bpm. Exam Location:  Inpatient Procedure: 2D Echo, Cardiac Doppler and Color Doppler Indications:    Stroke  History:        Patient has no prior history of Echocardiogram examinations.                 Risk Factors:Current Smoker and Dyslipidemia. ETOH abuse.  Sonographer:    Ross Ludwig RDCS (AE) Referring Phys: [id number] CHRISTOPHER J POLLINA  Sonographer Comments: Patient  supine in cervical collar. Unable to use Definity due to lack of IV access. Per RN, patient is awaiting PICC line. IMPRESSIONS  1. Left ventricular ejection fraction, by estimation, is 60 to 65%. The left ventricle has normal function. The left ventricle has no regional wall motion abnormalities. Left ventricular diastolic parameters are consistent with Grade I diastolic dysfunction (impaired relaxation).  2. Right ventricular systolic function is normal. The right ventricular size is normal. Tricuspid regurgitation signal is inadequate for assessing PA pressure.  3. The mitral valve is normal in structure. No evidence of mitral valve regurgitation. No evidence of mitral stenosis.  4. The aortic valve is tricuspid. Aortic valve regurgitation is not visualized. Mild aortic valve sclerosis is present, with no evidence of aortic valve stenosis.  5. The inferior vena cava is normal in size with greater than 50% respiratory variability, suggesting right atrial pressure of 3 mmHg. Comparison(s): No prior Echocardiogram. FINDINGS  Left Ventricle: Left ventricular ejection fraction, by estimation, is 60 to 65%. The left ventricle has normal function. The left ventricle has no regional wall motion abnormalities. The left ventricular internal cavity size was normal in size. There is  no left ventricular hypertrophy. Left ventricular diastolic parameters are consistent with Grade I diastolic dysfunction (impaired relaxation). Right Ventricle: The right ventricular size is normal. No increase in right ventricular wall thickness. Right ventricular systolic function is normal. Tricuspid regurgitation signal is inadequate for assessing PA pressure. Left Atrium: Left atrial size was normal in size. Right Atrium: Right atrial size was normal in size. Pericardium: There is no evidence of pericardial effusion. Mitral Valve: The mitral valve is normal in structure. No evidence of mitral valve regurgitation. No evidence of mitral valve  stenosis. MV peak gradient, 3.0 mmHg. The mean mitral valve gradient is 2.0 mmHg. Tricuspid Valve: The tricuspid valve is normal in  structure. Tricuspid valve regurgitation is not demonstrated. Aortic Valve: The aortic valve is tricuspid. Aortic valve regurgitation is not visualized. Mild aortic valve sclerosis is present, with no evidence of aortic valve stenosis. Aortic valve mean gradient measures 4.0 mmHg. Aortic valve peak gradient measures 6.5 mmHg. Aortic valve area, by VTI measures 2.59 cm. Pulmonic Valve: The pulmonic valve was not well visualized. Pulmonic valve regurgitation is not visualized. No evidence of pulmonic stenosis. Aorta: The aortic root is normal in size and structure. Venous: The inferior vena cava is normal in size with greater than 50% respiratory variability, suggesting right atrial pressure of 3 mmHg. IAS/Shunts: The atrial septum is grossly normal.  LEFT VENTRICLE PLAX 2D LVIDd:         4.60 cm  Diastology LVIDs:         3.00 cm  LV e' medial:    8.38 cm/s LV PW:         0.90 cm  LV E/e' medial:  8.1 LV IVS:        0.90 cm  LV e' lateral:   9.57 cm/s LVOT diam:     2.00 cm  LV E/e' lateral: 7.1 LV SV:         68 LV SV Index:   35 LVOT Area:     3.14 cm  RIGHT VENTRICLE             IVC RV Basal diam:  2.60 cm     IVC diam: 2.00 cm RV S prime:     10.80 cm/s TAPSE (M-mode): 1.9 cm LEFT ATRIUM             Index       RIGHT ATRIUM           Index LA diam:        2.80 cm 1.46 cm/m  RA Area:     15.00 cm LA Vol (A2C):   42.7 ml 22.19 ml/m RA Volume:   35.20 ml  18.29 ml/m LA Vol (A4C):   19.2 ml 9.98 ml/m LA Biplane Vol: 28.6 ml 14.86 ml/m  AORTIC VALVE AV Area (Vmax):    2.37 cm AV Area (Vmean):   2.24 cm AV Area (VTI):     2.59 cm AV Vmax:           127.00 cm/s AV Vmean:          91.800 cm/s AV VTI:            0.261 m AV Peak Grad:      6.5 mmHg AV Mean Grad:      4.0 mmHg LVOT Vmax:         96.00 cm/s LVOT Vmean:        65.500 cm/s LVOT VTI:          0.215 m LVOT/AV VTI ratio:  0.82  AORTA Ao Root diam: 3.00 cm Ao Asc diam:  2.90 cm MITRAL VALVE MV Area (PHT): 3.03 cm    SHUNTS MV Area VTI:   2.54 cm    Systemic VTI:  0.22 m MV Peak grad:  3.0 mmHg    Systemic Diam: 2.00 cm MV Mean grad:  2.0 mmHg MV Vmax:       0.87 m/s MV Vmean:      61.6 cm/s MV Decel Time: 250 msec MV E velocity: 68.10 cm/s MV A velocity: 80.60 cm/s MV E/A ratio:  0.84 Riley Lam MD Electronically signed by Riley Lam MD Signature Date/Time: 12/01/2020/2:03:00 PM  Final    [place] EKG SITE RITE  Result Date: 12/01/2020 If Site Rite image not attached, placement could not be confirmed due to current cardiac rhythm.   PHYSICAL EXAM General: Unkempt frail middle-aged Caucasian lady chronically ill,  mod distress. Psych: n/a d/t lethergy Eyes: No scleral injection HENT: C-collar in place Head: Normocephalic.  Cardiovascular: Normal rate and regular rhythm.  Respiratory: Effort normal and breath sounds normal to anterior ascultation GI: Soft. Mild distension. There is no apparent tenderness.  Skin: multiple lesions, scabs, abrasions and bruising over all limbs and buttock.  Multiple scattered papular small circumscribed lesions all over extremities and body.-psoriatic lesions   Neurological Examination Mental Status: Awake and alert.  Globally aphasic and mute.  But will try to speak and make a few guttural sounds.  Follows only simple midline and a few one-step commands. Cranial Nerves: right facial weakness. PERRL. Some left gaze deviation with decreased blink. Unable to test EOM, shrugs or tongue d/t no cooperation.  Motor: decreased bulk, flaccid right hemiparesis. Withdraws to pain appropriately on left, decreased on right.  Purposeful antigravity movements on the left side. Plantars: Right: mute   Left: downgoing Cerebellar: unable Gait: unable  ASSESSMENT/PLAN Rebecca Wilson is a 60 y.o. female with history of anxiety, depression, EtOh abuse who presents as a stroke code  with R hemiplegia and aphasia. Found to have a large L MCA stroke with ASPECTS of 4. CT angio with L ICA occlusion at the origin and L MCA occlusion. Her Acomm is patent and she has good BL A1. Her L PCA originates from the basilar with no significant narrowing in the posterior circulation. She was outside the tPA window, not a candidate for thrombectomy due to ASPECTs score of 4 and what appears to be a completed L MCA stroke.   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  With mild cytotoxic edema and slight left right midline shift Code Stroke CT head LMCA stroke Small vessel disease. Atrophy. ASPECTS 4.    CTA head & neck Complete occlusion of LICA MRI  Large acute LMCA with widespread edema and some petechial hemorrhagic conversion.  MR C-spine: ?abnormal cord signal; mod spinal stenosis C5-6. No acute traumatic injury, but remains in C-collar. Will ask ER or CCM to clear her. 2D Echo ejection fraction 6065%.  No wall motion abnormalities. LDL 71 HgbA1c 5.4 VTE prophylaxis - lovenox    There are no active orders of the following types: Diet, Nourishments.   none  prior to admission, now on aspirin 300 mg suppository daily.  Therapy recommendations: Skilled nursing facility Disposition:  pending  Hypertension Home meds:  none Stable Permissive hypertension (OK if < 220/120) but gradually normalize in 5-7 days Long-term BP goal normotensive  Hyperlipidemia Home meds:  none LDL 71, goal < 70 High intensity statin not indicated Continue statin at discharge  Diabetes type II no dx HgbA1c 5.4, goal < 7.0 CBGs Recent Labs    11/30/20 0006 11/30/20 1625 12/01/20 1603  GLUCAP 124* 116* 97    SSI  Other Stroke Risk Factors Cigarette smoker; advised to stop smoking Obesity, Body mass index is 31.18 kg/m., BMI >/= 30 associated with increased stroke risk, recommend weight loss, diet and exercise as appropriate  ETOH  abuse  Other Active Problems Anxiety/depression ETOH abuse- Thiamine and watch for withdrawals Clinically significant cerebral edema- ask CCM to consult and 3% NaCl has been started  [facility]  day # 1 Patient seems little more alert today and cytotoxic edema is controlled with hypertonic saline with serum sodium at goal peripheral IVs infiltrated hence we will put a PICC line.  Speech therapy to swallow eval if patient fails may need core track tube for nourishment and medicines.  Long discussion with patient's daughter and son at the bedside about plan of care and answered questions.  Family wants full aggressive care including nursing home.  Discussed with Dr. Merrily Pew critical care medicine. This patient is critically ill and at significant risk of neurological worsening, death and care requires constant monitoring of vital signs, hemodynamics,respiratory and cardiac monitoring, extensive review of multiple databases, frequent neurological assessment, discussion with family, other specialists and medical decision making of high complexity.I have made any additions or clarifications directly to the above note.This critical care time does not reflect procedure time, or teaching time or supervisory time of PA/NP/Med Resident etc but could involve care discussion time.  I spent 35 minutes of neurocritical care time  in the care of  this patient.      Delia Heady, MD Medical Director [facility] Pager: [phone] 12/01/2020 4:32 PM   Delia Heady, MD Medical Director [facility] Pager: [phone] 12/01/2020 4:32 PM  To contact Stroke Continuity provider, please refer to [email or web address]. After hours, contact General Neurology

## 2020-12-01 NOTE — Progress Notes (Signed)
Initial Nutrition Assessment  DOCUMENTATION CODES:   Not applicable  INTERVENTION:   Initiate tube feeding via Cortrak tube: Osmolite 1.5 at 55 ml/h (1320 ml per day) Prosource TF 45 ml BID  Provides 2060 kcal, 104 gm protein, 1003 ml free water daily  MVI with minerals daily   NUTRITION DIAGNOSIS:   Inadequate oral intake related to inability to eat as evidenced by NPO status.  GOAL:   Patient will meet greater than or equal to 90% of their needs   MONITOR:   Diet advancement, TF tolerance  REASON FOR ASSESSMENT:   Consult Enteral/tube feeding initiation and management  ASSESSMENT:   Pt with PMH of ETOH abuse, anxiety and bipolar disorder admitted with large left MCA/ACA territory stroke with cerebral edema.   Pt discussed during ICU rounds and with RN.  Pt aphasic. Ortho following for clavicle fx.  Plan for cortrak placement today.    Medications reviewed and include: thiamine  Mag sulfate x 1 Hypertonic saline  Labs reviewed: Na 151, magnesium 1.5   Pt getting bath at time of visit.   Diet Order:   Diet Order     None       EDUCATION NEEDS:   Not appropriate for education at this time  Skin:  Skin Assessment: Reviewed RN Assessment  Last BM:  unknown  Height:   Ht Readings from Last 1 Encounters:  11/30/20 5\' 5"  (1.651 m)    Weight:   Wt Readings from Last 1 Encounters:  11/30/20 85 kg    Ideal Body Weight:     BMI:  Body mass index is 31.18 kg/m.  Estimated Nutritional Needs:   Kcal:  1900-2100  Protein:  100-115 gams  Fluid:  >1.9 L/day  01/31/21., RD, LDN, CNSC See AMiON for contact information

## 2020-12-01 NOTE — Consult Note (Signed)
Reason for Consult:Right clav fx Referring Physician: Delia Heady Time called: 0827 Time at bedside: 1034   Rebecca Wilson is an 60 y.o. female.  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 She is aphasic and cannot contribute to history. It apparently caused her to fall. A chest x-rays showed an incidental finding of a clavicle fx and orthopedic surgery was consulted.  Past Medical History:  Diagnosis Date   Anxiety    Depression     Past Surgical History:  Procedure Laterality Date   ABDOMINAL HYSTERECTOMY     CESAREAN SECTION     x4   CHOLECYSTECTOMY     KYPHOPLASTY N/A 08/27/2020   Procedure: KYPHOPLASTY T12;  Surgeon: Venita Lick, MD;  Location: [facility] OR;  Service: Orthopedics;  Laterality: N/A;   PLANTAR FASCIA SURGERY Bilateral     No family history on file.  Social History:  reports that she has been smoking. She has been smoking an average of 1.50 packs per day. She has never used smokeless tobacco. She reports previous alcohol use. She reports that she does not use drugs.  Allergies: No Known Allergies  Medications: I have reviewed the patient's current medications.  Results for orders placed or performed during the hospital encounter of Dec 12, 2020 (from the past 48 hour(s))  Protime-INR     Status: None   Collection Time: 12-12-20 11:58 PM  Result Value Ref Range   Prothrombin Time 14.0 11.4 - 15.2 seconds   INR 1.1 0.8 - 1.2    Comment: (NOTE) INR goal varies based on device and disease states. Performed at [facility], [address], [place], [place] [address]   APTT     Status: Abnormal   Collection Time: 2020-12-12 11:58 PM  Result Value Ref Range   aPTT 20 (L) 24 - 36 seconds    Comment: Performed at [facility], [address] [place], [place] [address]  CBC     Status: Abnormal   Collection Time: 2020-12-12 11:58 PM  Result Value Ref Range   WBC 13.6 (H) 4.0 - 10.5 K/uL   RBC 3.84 (L) 3.87 - 5.11 MIL/uL   Hemoglobin 14.1 12.0 - 15.0 g/dL    HCT 86.5 78.4 - 69.6 %   MCV 110.9 (H) 80.0 - 100.0 fL   MCH 36.7 (H) 26.0 - 34.0 pg   MCHC 33.1 30.0 - 36.0 g/dL   RDW 29.5 (H) 28.4 - 13.2 %   Platelets 351 150 - 400 K/uL    Comment: REPEATED TO VERIFY   nRBC 0.0 0.0 - 0.2 %    Comment: Performed at [facility], [address], [place], [place] [address]  Differential     Status: Abnormal   Collection Time: Dec 12, 2020 11:58 PM  Result Value Ref Range   Neutrophils Relative % 86 %   Neutro Abs 11.7 (H) 1.7 - 7.7 K/uL   Lymphocytes Relative 4 %   Lymphs Abs 0.6 (L) 0.7 - 4.0 K/uL   Monocytes Relative 9 %   Monocytes Absolute 1.2 (H) 0.1 - 1.0 K/uL   Eosinophils Relative 0 %   Eosinophils Absolute 0.0 0.0 - 0.5 K/uL   Basophils Relative 0 %   Basophils Absolute 0.0 0.0 - 0.1 K/uL   Immature Granulocytes 1 %   Abs Immature Granulocytes 0.07 0.00 - 0.07 K/uL    Comment: Performed at [facility], [address] [place], [place] [address]  Comprehensive metabolic panel  Status: Abnormal   Collection Time: 12/14/2020 11:58 PM  Result Value Ref Range   Sodium 140 135 - 145 mmol/L   Potassium 4.2 3.5 - 5.1 mmol/L   Chloride 105 98 - 111 mmol/L   CO2 24 22 - 32 mmol/L   Glucose, Bld 117 (H) 70 - 99 mg/dL    Comment: Glucose reference range applies only to samples taken after fasting for at least 8 hours.   BUN 11 6 - 20 mg/dL   Creatinine, Ser 1.61 (H) 0.44 - 1.00 mg/dL   Calcium 8.5 (L) 8.9 - 10.3 mg/dL   Total Protein 6.9 6.5 - 8.1 g/dL   Albumin 3.0 (L) 3.5 - 5.0 g/dL   AST 31 15 - 41 U/L   ALT 20 0 - 44 U/L   Alkaline Phosphatase 101 38 - 126 U/L   Total Bilirubin 2.3 (H) 0.3 - 1.2 mg/dL   GFR, Estimated >09 >60 mL/min    Comment: (NOTE) Calculated using the CKD-EPI Creatinine Equation (2021)    Anion gap 11 5 - 15    Comment: Performed at [facility], [address], [place], [place] [address]  CBG monitoring, ED     Status: Abnormal   Collection Time: 11/30/20 12:06 AM  Result Value Ref Range    Glucose-Capillary 124 (H) 70 - 99 mg/dL    Comment: Glucose reference range applies only to samples taken after fasting for at least 8 hours.  I-Stat beta hCG blood, ED     Status: None   Collection Time: 11/30/20 12:06 AM  Result Value Ref Range   I-stat hCG, quantitative <5.0 <5 mIU/mL   Comment 3            Comment:   GEST. AGE      CONC.  (mIU/mL)   <=1 WEEK        5 - 50     2 WEEKS       50 - 500     3 WEEKS       100 - 10,000     4 WEEKS     1,000 - 30,000        FEMALE AND NON-PREGNANT FEMALE:     LESS THAN 5 mIU/mL   I-stat chem 8, ED     Status: Abnormal   Collection Time: 11/30/20 12:07 AM  Result Value Ref Range   Sodium 141 135 - 145 mmol/L   Potassium 4.1 3.5 - 5.1 mmol/L   Chloride 105 98 - 111 mmol/L   BUN 11 6 - 20 mg/dL   Creatinine, Ser 8.11 0.44 - 1.00 mg/dL   Glucose, Bld 914 (H) 70 - 99 mg/dL    Comment: Glucose reference range applies only to samples taken after fasting for at least 8 hours.   Calcium, Ion 1.00 (L) 1.15 - 1.40 mmol/L   TCO2 26 22 - 32 mmol/L   Hemoglobin 14.6 12.0 - 15.0 g/dL   HCT 78.2 95.6 - 21.3 %  Resp Panel by RT-PCR (Flu A&B, Covid) Nasopharyngeal Swab     Status: None   Collection Time: 11/30/20  1:25 AM   Specimen: Nasopharyngeal Swab; Nasopharyngeal(NP) swabs in vial transport medium  Result Value Ref Range   SARS Coronavirus 2 by RT PCR NEGATIVE NEGATIVE    Comment: (NOTE) SARS-CoV-2 target nucleic acids are NOT DETECTED.  The SARS-CoV-2 RNA is generally detectable in upper respiratory specimens during the acute phase of infection. The lowest concentration of SARS-CoV-2 viral  copies this assay can detect is 138 copies/mL. A negative result does not preclude SARS-Cov-2 infection and should not be used as the sole basis for treatment or other patient management decisions. A negative result may occur with  improper specimen collection/handling, submission of specimen other than nasopharyngeal swab, presence of viral  mutation(s) within the areas targeted by this assay, and inadequate number of viral copies(<138 copies/mL). A negative result must be combined with clinical observations, patient history, and epidemiological information. The expected result is Negative.  Fact Sheet for Patients:  BloggerCourse.comhttps://www.fda.gov/media/152166/download  Fact Sheet for Healthcare Providers:  SeriousBroker.ithttps://www.fda.gov/media/152162/download  This test is no t yet approved or cleared by the [place] FDA and  has been authorized for detection and/or diagnosis of SARS-CoV-2 by FDA under an Emergency Use Authorization (EUA). This EUA will remain  in effect (meaning this test can be used) for the duration of the COVID-19 declaration under Section 564(b)(1) of the Act, 21 U.S.C.section 360bbb-3(b)(1), unless the authorization is terminated  or revoked sooner.       Influenza A by PCR NEGATIVE NEGATIVE   Influenza B by PCR NEGATIVE NEGATIVE    Comment: (NOTE) The Xpert Xpress SARS-CoV-2/FLU/RSV plus assay is intended as an aid in the diagnosis of influenza from Nasopharyngeal swab specimens and should not be used as a sole basis for treatment. Nasal washings and aspirates are unacceptable for Xpert Xpress SARS-CoV-2/FLU/RSV testing.  Fact Sheet for Patients: BloggerCourse.comhttps://www.fda.gov/media/152166/download  Fact Sheet for Healthcare Providers: SeriousBroker.ithttps://www.fda.gov/media/152162/download  This test is not yet approved or cleared by the [place] FDA and has been authorized for detection and/or diagnosis of SARS-CoV-2 by FDA under an Emergency Use Authorization (EUA). This EUA will remain in effect (meaning this test can be used) for the duration of the COVID-19 declaration under Section 564(b)(1) of the Act, 21 U.S.C. section 360bbb-3(b)(1), unless the authorization is terminated or revoked.  Performed at [facility], [address] [place], [place] [address]   HIV Antibody (routine testing w rflx)      Status: None   Collection Time: 11/30/20  3:17 AM  Result Value Ref Range   HIV Screen 4th Generation wRfx Non Reactive Non Reactive    Comment: Performed at [facility], [address] [place], [place] [address]  CBC     Status: Abnormal   Collection Time: 11/30/20  3:17 AM  Result Value Ref Range   WBC 13.1 (H) 4.0 - 10.5 K/uL   RBC 3.70 (L) 3.87 - 5.11 MIL/uL   Hemoglobin 13.8 12.0 - 15.0 g/dL   HCT 09.839.3 11.936.0 - 14.746.0 %   MCV 106.2 (H) 80.0 - 100.0 fL   MCH 37.3 (H) 26.0 - 34.0 pg   MCHC 35.1 30.0 - 36.0 g/dL   RDW 82.917.5 (H) 56.211.5 - 13.015.5 %   Platelets 353 150 - 400 K/uL   nRBC 0.0 0.0 - 0.2 %    Comment: Performed at [facility], [address] [place], [place] [address]  Lipid panel     Status: Abnormal   Collection Time: 11/30/20  3:17 AM  Result Value Ref Range   Cholesterol 167 0 - 200 mg/dL   Triglycerides 469195 (H) <150 mg/dL   HDL 57 >62>40 mg/dL   Total CHOL/HDL Ratio 2.9 RATIO   VLDL 39 0 - 40 mg/dL   LDL Cholesterol 71 0 - 99 mg/dL    Comment:        Total Cholesterol/HDL:CHD Risk Coronary Heart Disease Risk  Table                     Men   Women  1/2 Average Risk   3.4   3.3  Average Risk       5.0   4.4  2 X Average Risk   9.6   7.1  3 X Average Risk  23.4   11.0        Use the calculated Patient Ratio above and the CHD Risk Table to determine the patient's CHD Risk.        ATP III CLASSIFICATION (LDL):  <100     mg/dL   Optimal  366-440  mg/dL   Near or Above                    Optimal  130-159  mg/dL   Borderline  347-425  mg/dL   High  >956     mg/dL   Very High Performed at [facility], [address] [place], [place] [address]   Hemoglobin A1c     Status: None   Collection Time: 11/30/20  3:17 AM  Result Value Ref Range   Hgb A1c MFr Bld 5.4 4.8 - 5.6 %    Comment: (NOTE) Pre diabetes:          5.7%-6.4%  Diabetes:              >6.4%  Glycemic control for   <7.0% adults with diabetes    Mean Plasma Glucose 108.28 mg/dL     Comment: Performed at [facility], [address], [place], [place] [address]  MRSA Next Gen by PCR, Nasal     Status: None   Collection Time: 11/30/20  1:25 PM   Specimen: Nasal Mucosa; Nasal Swab  Result Value Ref Range   MRSA by PCR Next Gen NOT DETECTED NOT DETECTED    Comment: (NOTE) The GeneXpert MRSA Assay (FDA approved for NASAL specimens only), is one component of a comprehensive MRSA colonization surveillance program. It is not intended to diagnose MRSA infection nor to guide or monitor treatment for MRSA infections. Test performance is not FDA approved in patients less than 109 years old. Performed at [facility], [address], [place], [place] [address]   Magnesium     Status: Abnormal   Collection Time: 11/30/20  1:37 PM  Result Value Ref Range   Magnesium 1.5 (L) 1.7 - 2.4 mg/dL    Comment: Performed at [facility], [address], [place], [place] [address]  Phosphorus     Status: None   Collection Time: 11/30/20  1:37 PM  Result Value Ref Range   Phosphorus 3.2 2.5 - 4.6 mg/dL    Comment: Performed at [facility], [address] [place], [place] [address]  CK     Status: None   Collection Time: 11/30/20  1:37 PM  Result Value Ref Range   Total CK 121 38 - 234 U/L    Comment: Performed at [facility], [address], [place], [place] [address]  Sodium     Status: None   Collection Time: 11/30/20  1:37 PM  Result Value Ref Range   Sodium 143 135 - 145 mmol/L    Comment: Performed at [facility], [address], [place], [place] [address]  Glucose, capillary     Status: Abnormal   Collection Time: 11/30/20  4:25 PM  Result Value Ref Range   Glucose-Capillary 116 (H) 70 - 99 mg/dL    Comment: Glucose reference range applies only to samples taken after fasting for at least 8 hours.  Urine rapid drug screen (hosp performed)     Status: Abnormal   Collection Time: 11/30/20  5:23 PM  Result Value Ref Range   Opiates NONE  DETECTED NONE DETECTED   Cocaine NONE DETECTED NONE DETECTED   Benzodiazepines POSITIVE (A) NONE DETECTED   Amphetamines NONE DETECTED NONE DETECTED   Tetrahydrocannabinol NONE DETECTED NONE DETECTED   Barbiturates NONE DETECTED NONE DETECTED    Comment: (NOTE) DRUG SCREEN FOR MEDICAL PURPOSES ONLY.  IF CONFIRMATION IS NEEDED FOR ANY PURPOSE, NOTIFY LAB WITHIN 5 DAYS.  LOWEST DETECTABLE LIMITS FOR URINE DRUG SCREEN Drug Class                     Cutoff (ng/mL) Amphetamine and metabolites    1000 Barbiturate and metabolites    200 Benzodiazepine                 200 Tricyclics and metabolites     300 Opiates and metabolites        300 Cocaine and metabolites        300 THC                            50 Performed at [facility], [address], [place], [place] [address]   Sodium     Status: None   Collection Time: 11/30/20  6:12 PM  Result Value Ref Range   Sodium 144 135 - 145 mmol/L    Comment: Performed at [facility], [address], [place], [place] [address]  Sodium     Status: Abnormal   Collection Time: 12/01/20 12:13 AM  Result Value Ref Range   Sodium 149 (H) 135 - 145 mmol/L    Comment: Performed at [facility], [address], [place], [place] [address]  Sodium     Status: Abnormal   Collection Time: 12/01/20  6:19 AM  Result Value Ref Range   Sodium 151 (H) 135 - 145 mmol/L    Comment: Performed at [facility], [address] [place], [place] [address]    CT Head Wo Contrast  Result Date: 12/01/2020 CLINICAL DATA:  Stroke follow-up EXAM: CT HEAD WITHOUT CONTRAST TECHNIQUE: Contiguous axial images were obtained from the base of the skull through the vertex without intravenous contrast. COMPARISON:  11/30/2020 FINDINGS: Brain: Slightly increased cytotoxic edema within the left MCA territory. There is approximately 2 mm of rightward midline shift. No acute hemorrhage. Vascular: No abnormal hyperdensity of the major intracranial arteries  or dural venous sinuses. No intracranial atherosclerosis. Skull: The visualized skull base, calvarium and extracranial soft tissues are normal. Sinuses/Orbits: No fluid levels or advanced mucosal thickening of the visualized paranasal sinuses. No mastoid or middle ear effusion. The orbits are normal. IMPRESSION: 1. Slightly increased cytotoxic edema within the left MCA territory with 2 mm of rightward midline shift. 2. No acute hemorrhage. Electronically Signed   By: Deatra Robinson M.D.   On: 12/01/2020 02:47   CT HEAD WO CONTRAST  Result Date: 11/30/2020 CLINICAL DATA:  60 year old female status post fall from bed. Found down. Code stroke presentation. Left ICA and MCA occlusion, left MCA infarct. EXAM: CT HEAD WITHOUT CONTRAST TECHNIQUE: Contiguous axial images were obtained from the  base of the skull through the vertex without intravenous contrast. COMPARISON:  0011 hours today FINDINGS: Brain: Patchy and confluent cytotoxic edema scattered throughout the left MCA territory is stable since 0011 hours today. Questionable petechial hemorrhage (series 3, image 17) but no malignant hemorrhagic transformation. Subtle effacement of the left lateral ventricle. No midline shift. Basilar cisterns remain patent. Stable gray-white matter differentiation elsewhere. No ventriculomegaly. Vascular: Stable noncontrast CT appearance of the vessels. Skull: Stable and intact. Sinuses/Orbits: Visualized paranasal sinuses and mastoids are stable and well aerated. Other: No orbit or scalp soft tissue injury identified. IMPRESSION: 1. Stable relatively large Left MCA infarct since 0011 hours today. Questionable petechial hemorrhage but no malignant hemorrhagic transformation. No significant intracranial mass effect at this time. 2. No new intracranial abnormality. Electronically Signed   By: Odessa Fleming M.D.   On: 11/30/2020 06:08   CT CERVICAL SPINE WO CONTRAST  Result Date: 11/30/2020 CLINICAL DATA:  60 year old female status post  fall from bed. Found down. Code stroke presentation. Left ICA and MCA occlusion, left MCA infarct. EXAM: CT CERVICAL SPINE WITHOUT CONTRAST TECHNIQUE: Multidetector CT imaging of the cervical spine was performed without intravenous contrast. Multiplanar CT image reconstructions were also generated. COMPARISON:  Cervical spine CT 0022 hours today. FINDINGS: Alignment: Stable. Mild reversal of cervical lordosis. Cervicothoracic junction alignment is within normal limits. Bilateral posterior element alignment is within normal limits. Skull base and vertebrae: Visualized skull base is intact. No atlanto-occipital dissociation. C1 and C2 appear intact and aligned. No acute osseous abnormality identified in the cervical spine. Soft tissues and spinal canal: No prevertebral fluid or swelling. No visible canal hematoma. Partially retropharyngeal course of both carotids. Disc levels: Chronic C2-C3 left side facet arthropathy and facet ankylosis. Mild degenerative appearing anterolisthesis of C3 on C4 with chronic facet degeneration greater on the right. Advanced chronic disc and endplate degeneration C4-C5 through C7-T1. At least mild and possibly moderate associated degenerative cervical spinal stenosis C4 C5 through C6 C7. Upper chest: Healing right clavicle shaft fracture on series 3, image 62. Chronic posterior right 1st rib fracture. Other visible upper thoracic levels appear intact. Negative lung apices. IMPRESSION: 1. No acute traumatic injury identified in the cervical spine. 2. Severe chronic cervical spine degeneration. At least mild and possibly moderate degenerative cervical spinal stenosis. 3. Healing right clavicle shaft fracture. Chronic right 1st rib fracture. Electronically Signed   By: Odessa Fleming M.D.   On: 11/30/2020 06:11   MR BRAIN WO CONTRAST  Result Date: 11/30/2020 CLINICAL DATA:  60 year old female with code stroke presentation, left ICA and MCA occlusion. Left MCA infarct. Neck trauma. No IV tPA or  thrombectomy. EXAM: MRI HEAD WITHOUT CONTRAST TECHNIQUE: Multiplanar, multiecho pulse sequences of the brain and surrounding structures were obtained without intravenous contrast. COMPARISON:  CT head and cervical spine, CTA head and neck earlier today. FINDINGS: Brain: Patchy restricted diffusion throughout most of the left MCA territory, including confluent involvement of the lentiform, patchy involvement of the caudate, and involvement of the left mamillary body. Widespread cytotoxic edema. Petechial hemorrhage in the left parietal and posterior temporal lobes as seen on series 12, image 29. No midline shift. Basilar cisterns remain patent. Subtle effacement of the left lateral ventricle. Left ACA and PCA territories relatively spared. No right hemisphere or posterior fossa restricted diffusion. No other intracranial blood products. No ventriculomegaly, extra-axial collection. Outside of the left MCA territory gray and white matter signal appears normal for age. Cervicomedullary junction and pituitary are within normal limits. Vascular: Absent  left ICA flow void in the neck and skull base. Other Major intracranial vascular flow voids are preserved. Skull and upper cervical spine: Cervical spine reported separately. Visualized bone marrow signal is within normal limits. Sinuses/Orbits: Disconjugate gaze.  Otherwise negative. Other: Visible internal auditory structures appear normal. Negative visible scalp and face. IMPRESSION: 1. Large acute infarct affecting most of the Left MCA territory. Widespread cytotoxic edema with petechial hemorrhage in the left parietal and posterior temporal lobes. No malignant hemorrhagic transformation. No significant intracranial mass effect at this time. 2. Absent Left ICA flow void in keeping with the CTA findings today. 3. Cervical Spine MRI reported separately. Electronically Signed   By: Odessa Fleming M.D.   On: 11/30/2020 08:15   MR CERVICAL SPINE WO CONTRAST  Result Date:  11/30/2020 CLINICAL DATA:  60 year old female with code stroke presentation, left ICA and MCA occlusion. Left MCA infarct. Neck trauma. No IV tPA or thrombectomy. EXAM: MRI CERVICAL SPINE WITHOUT CONTRAST TECHNIQUE: Multiplanar, multisequence MR imaging of the cervical spine was performed. No intravenous contrast was administered. COMPARISON:  Cervical spine CTs earlier today. FINDINGS: Study is mildly degraded by motion artifact despite repeated imaging attempts, and no axial GRE images could be obtained. Alignment: Straightening of lordosis with less reversal compared to the CTs day. No significant spondylolisthesis. Vertebrae: Widespread degenerative endplate marrow signal changes in the cervical spine. No convincing No marrow edema or evidence of acute osseous abnormality. Normal background bone marrow signal. Cord: Questionable faint signal abnormality in the cervical spinal cord associated with degenerative stenosis and cord mass effect at C5-C6 (series 2, image 7). Somewhat diminutive cord volume both above and below that level, with no other cord signal abnormality. Posterior Fossa, vertebral arteries, paraspinal tissues: Abnormal left ICA flow void in the neck. Other major vascular flow voids in the neck are preserved. Cervicomedullary junction is within normal limits. Brain findings reported separately. Normal prevertebral soft tissues. No paraspinal soft tissue edema or inflammation. Disc levels: C2-C3: Left facet ankylosis and hypertrophy. Moderate left C3 foraminal stenosis. C3-C4: Moderate bilateral facet hypertrophy. Mild disc bulging and endplate spurring mostly affecting the right neural foramen. No spinal stenosis. Mild to moderate left and moderate to severe right C4 foraminal stenosis. C4-C5: Disc space loss. Circumferential disc osteophyte complex eccentric to the right. Effaced ventral CSF space but no significant spinal stenosis. Mild to moderate left and moderate to severe right C5 foraminal  stenosis. C5-C6: Disc space loss with bulky circumferential disc osteophyte complex. Broad-based posterior component on series 6, image 26. Mild ligament flavum hypertrophy. Mild to moderate spinal stenosis and spinal cord mass effect. Moderate to severe left greater than right C6 foraminal stenosis. C6-C7: Disc space loss with circumferential disc osteophyte complex. Broad-based posterior component. Mild spinal stenosis. Mild if any cord mass effect. Moderate to severe bilateral C7 foraminal stenosis greater on the right. C7-T1: Disc space loss with circumferential disc osteophyte complex. Broad-based posterior component but no spinal stenosis. Moderate to severe bilateral C8 foraminal stenosis greater on the right. No visible upper thoracic stenosis. IMPRESSION: 1. Mildly motion degraded and truncated exam despite repeated imaging attempts. 2. Advanced cervical spine degeneration with up to Moderate spinal stenosis at C5-C6 with cord mass effect AND questionable abnormal cord signal, such as from developing myelomalacia. 3. No acute traumatic injury identified. Chronic left C2-C3 facet ankylosis. 4. Mild degenerative spinal stenosis at C6-C7. 5. Widespread moderate or severe degenerative neural foraminal stenosis involving the right C4, right C5, bilateral C6, bilateral C7 and bilateral C8  nerve levels. Electronically Signed   By: Odessa Fleming M.D.   On: 11/30/2020 08:22   CT C-SPINE NO CHARGE  Result Date: 11/30/2020 CLINICAL DATA:  Stroke EXAM: CT CERVICAL SPINE WITHOUT CONTRAST TECHNIQUE: Multidetector CT imaging of the cervical spine was performed without intravenous contrast. Multiplanar CT image reconstructions were also generated. COMPARISON:  None. FINDINGS: Alignment: Reversal of normal cervical lordosis. No static subluxation. Skull base and vertebrae: No acute fracture. Soft tissues and spinal canal: Occlusion of the left internal carotid artery. Disc levels: No high-grade spinal canal stenosis. Disc  space narrowing and uncovertebral hypertrophy cause moderate-to-severe neural foraminal stenosis at the C4-7 levels. Upper chest: Negative. Other: None. IMPRESSION: 1. No acute fracture or static subluxation of the cervical spine. 2. Occlusion of the left internal carotid artery. 3. Moderate-to-severe neural foraminal stenosis at the C4-7 levels. Electronically Signed   By: Deatra Robinson M.D.   On: 11/30/2020 01:25   DG CHEST PORT 1 VIEW  Result Date: 11/30/2020 CLINICAL DATA:  Status post fall.  Shoulder pain. EXAM: PORTABLE CHEST 1 VIEW COMPARISON:  08/27/2020 FINDINGS: The heart size is normal. No pleural effusion or edema. No airspace opacities or pneumothorax. Nodular density within the right upper lobe measures 1.1 cm and appears new when compared with previous exam. Acute fracture deformity involving the mid shaft of right clavicle is noted. Fracture fragments are in near anatomic alignment. IMPRESSION: 1. Acute mid shaft of right clavicle fracture 2. Nodular density within the right upper lobe is new when compared with previous exam and may represent a pulmonary nodule. When the patient is clinically stable recommend further evaluation with upright PA and lateral chest radiograph to confirm persistence. If persistent then CT of the chest would be advised. Electronically Signed   By: Signa Kell M.D.   On: 11/30/2020 12:59   CT HEAD CODE STROKE WO CONTRAST  Result Date: 11/30/2020 CLINICAL DATA:  Code stroke. Right-sided facial droop and confusion. EXAM: CT HEAD WITHOUT CONTRAST TECHNIQUE: Contiguous axial images were obtained from the base of the skull through the vertex without intravenous contrast. COMPARISON:  None. FINDINGS: Brain: There is no mass, hemorrhage or extra-axial collection. The size and configuration of the ventricles and extra-axial CSF spaces are normal. There is a large subacute infarct within the left MCA territory. However, there is no midline shift or other significant mass  effect. No hydrocephalus. Vascular: No abnormal hyperdensity of the major intracranial arteries or dural venous sinuses. No intracranial atherosclerosis. Skull: The visualized skull base, calvarium and extracranial soft tissues are normal. Sinuses/Orbits: No fluid levels or advanced mucosal thickening of the visualized paranasal sinuses. No mastoid or middle ear effusion. The orbits are normal. ASPECTS [facility] Stroke Program Early CT Score) - Ganglionic level infarction (caudate, lentiform nuclei, internal capsule, insula, M1-M3 cortex): 3 - Supraganglionic infarction (M4-M6 cortex): 1 Total score (0-10 with 10 being normal): 4 IMPRESSION: 1. Large subacute infarct within the left MCA territory without hemorrhage or mass effect. 2. No acute hemorrhage. 3. ASPECTS is 4. 4. These results were communicated to Dr. Erick Blinks at 12:18 am on 11/30/2020 by text page via the [facility] messaging system. Electronically Signed   By: Deatra Robinson M.D.   On: 11/30/2020 00:19   [place] EKG SITE RITE  Result Date: 12/01/2020 If Site Rite image not attached, placement could not be confirmed due to current cardiac rhythm.  CT ANGIO HEAD CODE STROKE  Result Date: 11/30/2020 CLINICAL DATA:  Left MCA stroke EXAM: CT ANGIOGRAPHY HEAD AND  NECK TECHNIQUE: Multidetector CT imaging of the head and neck was performed using the standard protocol during bolus administration of intravenous contrast. Multiplanar CT image reconstructions and MIPs were obtained to evaluate the vascular anatomy. Carotid stenosis measurements (when applicable) are obtained utilizing NASCET criteria, using the distal internal carotid diameter as the denominator. CONTRAST:  75mL OMNIPAQUE IOHEXOL 350 MG/ML SOLN COMPARISON:  None. FINDINGS: CTA NECK FINDINGS SKELETON: There is no bony spinal canal stenosis. No lytic or blastic lesion. OTHER NECK: Normal pharynx, larynx and major salivary glands. No cervical lymphadenopathy. Unremarkable thyroid gland. UPPER CHEST:  No pneumothorax or pleural effusion. No nodules or masses. AORTIC ARCH: There is calcific atherosclerosis of the aortic arch. There is no aneurysm, dissection or hemodynamically significant stenosis of the visualized portion of the aorta. Conventional 3 vessel aortic branching pattern. The visualized proximal subclavian arteries are widely patent. RIGHT CAROTID SYSTEM: Normal without aneurysm, dissection or stenosis. 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 VERTEBRAL ARTERIES: Left dominant configuration. Both origins are clearly patent. There is no dissection, occlusion or flow-limiting stenosis to the skull base (V1-V3 segments). CTA HEAD FINDINGS POSTERIOR CIRCULATION: --Vertebral arteries: Normal V4 segments. --Inferior cerebellar arteries: Normal. --Basilar artery: Normal. --Superior cerebellar arteries: Normal. --Posterior cerebral arteries (PCA): Normal. ANTERIOR CIRCULATION: --Intracranial internal carotid arteries: Left ICA is occluded. Right is normal. --Anterior cerebral arteries (ACA): Normal. Both A1 segments are present. Patent anterior communicating artery (a-comm). 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 Poor collateralization. VENOUS SINUSES: As permitted by contrast timing, patent. ANATOMIC VARIANTS: None Review of the MIP images confirms the above findings. IMPRESSION: 1. 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 2. 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 These results were called by telephone at the time of interpretation on 11/30/2020 at 12:36 am to provider [facility] , who verbally acknowledged these results. Aortic Atherosclerosis (ICD10-I70.0). Electronically Signed   By: Deatra Robinson M.D.   On: 11/30/2020 00:36   CT ANGIO NECK CODE STROKE  Result Date: 11/30/2020 CLINICAL DATA:   Left MCA stroke EXAM: CT ANGIOGRAPHY HEAD AND NECK TECHNIQUE: Multidetector CT imaging of the head and neck was performed using the standard protocol during bolus administration of intravenous contrast. Multiplanar CT image reconstructions and MIPs were obtained to evaluate the vascular anatomy. Carotid stenosis measurements (when applicable) are obtained utilizing NASCET criteria, using the distal internal carotid diameter as the denominator. CONTRAST:  75mL OMNIPAQUE IOHEXOL 350 MG/ML SOLN COMPARISON:  None. FINDINGS: CTA NECK FINDINGS SKELETON: There is no bony spinal canal stenosis. No lytic or blastic lesion. OTHER NECK: Normal pharynx, larynx and major salivary glands. No cervical lymphadenopathy. Unremarkable thyroid gland. UPPER CHEST: No pneumothorax or pleural effusion. No nodules or masses. AORTIC ARCH: There is calcific atherosclerosis of the aortic arch. There is no aneurysm, dissection or hemodynamically significant stenosis of the visualized portion of the aorta. Conventional 3 vessel aortic branching pattern. The visualized proximal subclavian arteries are widely patent. RIGHT CAROTID SYSTEM: Normal without aneurysm, dissection or stenosis. 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 VERTEBRAL ARTERIES: Left dominant configuration. Both origins are clearly patent. There is no dissection, occlusion or flow-limiting stenosis to the skull base (V1-V3 segments). CTA HEAD FINDINGS POSTERIOR CIRCULATION: --Vertebral arteries: Normal V4 segments. --Inferior cerebellar arteries: Normal. --Basilar  artery: Normal. --Superior cerebellar arteries: Normal. --Posterior cerebral arteries (PCA): Normal. ANTERIOR CIRCULATION: --Intracranial internal carotid arteries: Left ICA is occluded. Right is normal. --Anterior cerebral arteries (ACA): Normal. Both A1 segments are present. Patent anterior communicating  artery (a-comm). 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 Poor collateralization. VENOUS SINUSES: As permitted by contrast timing, patent. ANATOMIC VARIANTS: None Review of the MIP images confirms the above findings. IMPRESSION: 1. 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 2. 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 These results were called by telephone at the time of interpretation on 11/30/2020 at 12:36 am to provider [facility] , who verbally acknowledged these results. Aortic Atherosclerosis (ICD10-I70.0). Electronically Signed   By: Deatra Robinson M.D.   On: 11/30/2020 00:36    Review of Systems  Unable to perform ROS: Patient nonverbal  Blood pressure (!) 144/81, pulse 78, temperature 99.9 F (37.7 C), temperature source Axillary, resp. rate 14, height  (1.651 m), weight 85 kg, SpO2 97 %. Physical Exam Constitutional:      General: She is not in acute distress.    Appearance: She is well-developed. She is not diaphoretic.  HENT:     Head: Normocephalic and atraumatic.  Eyes:     General: No scleral icterus.       Right eye: No discharge.        Left eye: No discharge.     Conjunctiva/sclera: Conjunctivae normal.  Neck:     Comments: C-collar Cardiovascular:     Rate and Rhythm: Normal rate and regular rhythm.  Pulmonary:     Effort: Pulmonary effort is normal. No respiratory distress.  Musculoskeletal:     Comments: Right  shoulder, elbow, wrist, digits- no skin wounds, mod TTP clav, no instability, no blocks to motion  Sens  Ax/R/M/U could not assess  Mot   Ax/ R/ PIN/ M/ AIN/ U absent  Rad 2+  Skin:    General: Skin is warm and dry.  Neurological:     Mental Status: She is alert.    Assessment/Plan: Right clavicle fx -- Plan initial non-operative treatment with sling and NWB. F/u with Dr. Ave Filter in 2 weeks.    Freeman Caldron, PA-C Orthopedic  Surgery [phone] 12/01/2020, 11:53 AM

## 2020-12-01 NOTE — Progress Notes (Signed)
Peripherally Inserted Central Catheter Placement  The IV Nurse has discussed with the patient and/or persons authorized to consent for the patient, the purpose of this procedure and the potential benefits and risks involved with this procedure.  The benefits include less needle sticks, lab draws from the catheter, and the patient may be discharged home with the catheter. Risks include, but not limited to, infection, bleeding, blood clot (thrombus formation), and puncture of an artery; nerve damage and irregular heartbeat and possibility to perform a PICC exchange if needed/ordered by physician.  Alternatives to this procedure were also discussed.  Bard Power PICC patient education guide, fact sheet on infection prevention and patient information card has been provided to patient /or left at bedside.    PICC Placement Documentation  PICC Double Lumen 12/01/20 PICC Left Brachial 39 cm 0 cm (Active)  Indication for Insertion or Continuance of Line Administration of hyperosmolar/irritating solutions (i.e. TPN, Vancomycin, etc.);Poor Vasculature-patient has had multiple peripheral attempts or PIVs lasting less than 24 hours 12/01/20 1142  Exposed Catheter (cm) 0 cm 12/01/20 1142  Site Assessment Clean;Dry;Intact 12/01/20 1142  Lumen #1 Status Flushed;Saline locked;Blood return noted 12/01/20 1142  Lumen #2 Status Flushed;Saline locked;Blood return noted 12/01/20 1142  Dressing Type Transparent 12/01/20 1142  Dressing Status Clean;Dry;Intact 12/01/20 1142  Antimicrobial disc in place? Yes 12/01/20 1142  Safety Lock Not Applicable 12/01/20 1142  Line Care Connections checked and tightened 12/01/20 1142  Line Adjustment (NICU/IV Team Only) No 12/01/20 1142  Dressing Intervention New dressing 12/01/20 1142  Dressing Change Due 12/08/20 12/01/20 1142       Isaiah Cianci, Lajean Manes 12/01/2020, 11:43 AM

## 2020-12-01 NOTE — Progress Notes (Addendum)
RN went in at 0840 to assess patient and noticed only PIV had infiltrated with the hypertonic saline running through it. Site is swollen. Patient moves arm but is mute/aphasic so not able to answer any questions about pain or discomfort. 3% stopped and IV removed. MD and Pharmacy notified. This was the only access available and a difficult one to stick. IV team had to use ultrasound. Pt will need better access.   Ice applied and limb elevated. Order to place PICC line.    [place] Leap, [place] Scrape, RN

## 2020-12-01 NOTE — Progress Notes (Signed)
PT Cancellation Note  Patient Details Name: Rebecca Wilson MRN: [id number] DOB: May 09, 1961   Cancelled Treatment:    Reason Eval/Treat Not Completed: Active bedrest order - will check back as medically appropriate.   Marye Round, PT DPT Acute Rehabilitation Services Pager [phone]  Office [phone]    Truddie Coco 12/01/2020, 9:04 AM

## 2020-12-01 NOTE — Evaluation (Addendum)
Clinical/Bedside Swallow Evaluation Patient Details  Name: Rebecca Wilson MRN: [id number] Date of Birth: Mar 07, 1961  Today's Date: 12/01/2020 Time: SLP Start Time (ACUTE ONLY): 1437 SLP Stop Time (ACUTE ONLY): 1445 SLP Time Calculation (min) (ACUTE ONLY): 8 min  Past Medical History:  Past Medical History:  Diagnosis Date   Anxiety    Depression    Past Surgical History:  Past Surgical History:  Procedure Laterality Date   ABDOMINAL HYSTERECTOMY     CESAREAN SECTION     x4   CHOLECYSTECTOMY     KYPHOPLASTY N/A 08/27/2020   Procedure: KYPHOPLASTY T12;  Surgeon: Rebecca Lick, MD;  Location: [facility] OR;  Service: Orthopedics;  Laterality: N/A;   PLANTAR FASCIA SURGERY Bilateral    HPI:  60 yo F presented to ED 7/04 with acute large left MCA infarction and significant neurological deficits including complete aphasia, flaccid right hemiparesis, and non reactive left pupil. Pmhx  alcohol use disorder, COPD, psoriasis, depression, and bipolar disorder.   Assessment / Plan / Recommendation Clinical Impression  Pt suspected to have significant pharyngeal dysphagia s/p stroke. 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 There was a prolonged coughing episode after 3 consecutive sips water intermittently for 10 minutes. Multiple swallows and throat clears; grimacing during swallows suspicous for possible pharyngeal candidias. Pudding consistency not given as pt continued to cough. Recommend continue NPO and MBS tomorrow.      Aspiration Risk       Diet Recommendation   NPO       Other  Recommendations     Follow up Recommendations        Frequency and Duration   Min 2x week          Prognosis        Swallow Study   General HPI: 60 yo F presented to ED 7/04 with acute large left MCA infarction and significant neurological deficits including complete aphasia, flaccid right hemiparesis, and non reactive left pupil. Pmhx   alcohol use disorder, COPD, psoriasis, depression, and bipolar disorder. Type of Study: Bedside Swallow Evaluation Previous Swallow Assessment:  (none) Diet Prior to this Study: NPO Temperature Spikes Noted: No Respiratory Status: Nasal cannula History of Recent Intubation: No Behavior/Cognition: Alert;Distractible;Requires cueing Oral Cavity Assessment: Dry Oral Care Completed by SLP: Recent completion by staff Oral Cavity - Dentition: Missing dentition Vision: Impaired for self-feeding Self-Feeding Abilities: Needs assist Patient Positioning: Upright in bed Baseline Vocal Quality:  (no vocalization) Volitional Cough: Cognitively unable to elicit Volitional Swallow: Unable to elicit    Oral/Motor/Sensory Function Overall Oral Motor/Sensory Function: Mild impairment Facial ROM:  (did not follow commands) Facial Symmetry: Abnormal symmetry right;Suspected CN VII (facial) dysfunction Lingual ROM:  (did not follow commands)   Ice Chips Ice chips: Not tested   Thin Liquid Thin Liquid: Impaired Presentation: Cup Oral Phase Impairments: Reduced labial seal;Reduced lingual movement/coordination Oral Phase Functional Implications: Right anterior spillage Pharyngeal  Phase Impairments: Cough - Immediate;Cough - Delayed (coughed, swallowed for 10 min)    Nectar Thick Nectar Thick Liquid: Not tested   Honey Thick Honey Thick Liquid: Not tested   Puree Puree: Not tested (not recover from aspiration with water)   Solid     Solid: Not tested      Rebecca Wilson 12/01/2020,4:33 PM  Rebecca Wilson Rebecca Wilson.Ed Nurse, children's [phone] Office [phone]

## 2020-12-01 NOTE — Evaluation (Signed)
Speech Language Pathology Evaluation Patient Details Name: Rebecca Wilson MRN: [id number] DOB: 1960-10-30 Today's Date: 12/01/2020 Time: 2707-8675 SLP Time Calculation (min) (ACUTE ONLY): 8 min  Problem List:  Patient Active Problem List   Diagnosis Date Noted   Acute ischemic left MCA stroke (HCC) 11/30/2020   ICAO (internal carotid artery occlusion), left 11/30/2020   Vitamin D deficiency 11/30/2020   Bipolar disorder (HCC) 11/30/2020   Hyperlipidemia 11/30/2020   Stroke due to embolism of cerebral artery (HCC) 11/30/2020   Stroke (cerebrum) (HCC) 11/30/2020   Multiple closed fractures of ribs of left side    Cerebral edema (HCC)    Alcohol abuse    MDD (major depressive disorder), recurrent episode, severe (HCC) 05/15/2017   Past Medical History:  Past Medical History:  Diagnosis Date   Anxiety    Depression    Past Surgical History:  Past Surgical History:  Procedure Laterality Date   ABDOMINAL HYSTERECTOMY     CESAREAN SECTION     x4   CHOLECYSTECTOMY     KYPHOPLASTY N/A 08/27/2020   Procedure: KYPHOPLASTY T12;  Surgeon: Venita Lick, MD;  Location: [facility] OR;  Service: Orthopedics;  Laterality: N/A;   PLANTAR FASCIA SURGERY Bilateral    HPI:  60 yo F presented to ED 7/04 with acute large left MCA infarction and significant neurological deficits including complete aphasia, flaccid right hemiparesis, and non reactive left pupil. Pmhx  alcohol use disorder, COPD, psoriasis, depression, and bipolar disorder.   Assessment / Plan / Recommendation Clinical Impression  Pt exhibitis significant language and motor plannning and cognitive deficits. She was internally distracted after probable aspiration during preceeding swallow assessment. Upon initial arrival she did not follow commands or attempt to gesture, mouth to respond or communicate. Visual impairments are suspected as she attempts to focus on objects, therapist. Simple yes/no questions without response. Reading  comprehension assessed with 1-4 word/phrases without response (likely impacted by vision). She was unable to count with therapist with verbal and written information. Difficulty focusing and sustainting attention continued throughout evaluation. ST will intervene for language-cognitive therapy.    SLP Assessment  SLP Recommendation/Assessment: Patient needs continued Speech Lanaguage Pathology Services SLP Visit Diagnosis: Aphasia (R47.01);Apraxia (R48.2);Cognitive communication deficit (R41.841)    Follow Up Recommendations  Inpatient Rehab    Frequency and Duration min 2x/week  2 weeks      SLP Evaluation Cognition  Overall Cognitive Status: Impaired/Different from baseline Arousal/Alertness: Awake/alert Orientation Level:  (did not respond to y/n questions) Attention: Sustained Sustained Attention: Impaired Sustained Attention Impairment: Verbal basic;Functional basic Memory:  (TBA) Awareness: Impaired Awareness Impairment: Emergent impairment Problem Solving: Impaired Problem Solving Impairment: Functional basic Safety/Judgment: Impaired       Comprehension  Auditory Comprehension Overall Auditory Comprehension: Impaired Yes/No Questions:  (no response) Commands: Impaired One Step Basic Commands: 0-24% accurate Interfering Components: Attention Visual Recognition/Discrimination Discrimination: Not tested Reading Comprehension Reading Status: Impaired Word level: Impaired Interfering Components: Visual scanning;Visual acuity    Expression Expression Primary Mode of Expression: Nonverbal - gestures Verbal Expression Overall Verbal Expression:  (will assess as becomes vocal) Automatic Speech:  (no response) Pragmatics: Impairment Impairments: Eye contact Written Expression Dominant Hand:  (unknown) Written Expression: Not tested   Oral / Motor  Oral Motor/Sensory Function Overall Oral Motor/Sensory Function: Mild impairment Facial ROM:  (no command  following) Facial Symmetry: Abnormal symmetry right;Suspected CN VII (facial) dysfunction Lingual ROM:  (did not follow commands) Motor Speech Overall Motor Speech:  (unable to assess) Intelligibility: Unable to assess (comment)  GO                    [name] 12/01/2020, 4:50 PM  [name] [name].Ed Nurse, children's [phone] Office [phone]

## 2020-12-01 NOTE — Progress Notes (Signed)
  Echocardiogram 2D Echocardiogram has been performed.  Gerda Diss 12/01/2020, 11:21 AM

## 2020-12-01 NOTE — Progress Notes (Signed)
NAME:  Rebecca Wilson, MRN:  [id number], DOB:  1960-08-03, LOS: 1 ADMISSION DATE:  12-10-2020, CONSULTATION DATE: 11/30/2020 REFERRING MD:  Reymundo Poll, MD, CHIEF COMPLAINT: Found down  History of Present Illness:  Patient is aphasic most of the history is obtained by chart review  60 year old female with alcohol abuse, anxiety and bipolar disorder who was brought into the emergency department as a stroke code after she was found down.  Emergency department patient was noted to have right-sided weakness and right facial paralysis, stroke code was called, patient underwent CT head which showed large MCA/ACA territory stroke with cerebral edema.  Per neurology patient was not candidate for tPA or thrombectomy.  Due to malignant cerebral edema, PCCM was consulted to help with management.  Significant Hospital Events: Including procedures, antibiotic start and stop dates in addition to other pertinent events   Admitted to ICU  Interim History / Subjective:  No overnight issues Patient remains aphasic, mimicking examiner Serum sodium is trending up, Now it is 151.  Remain on 3% saline  Objective   Blood pressure (!) 151/77, pulse 76, temperature 99.9 F (37.7 C), temperature source Axillary, resp. rate 17, height 5\' 5"  (1.651 m), weight 85 kg, SpO2 97 %.        Intake/Output Summary (Last 24 hours) at 12/01/2020 0920 Last data filed at 12/01/2020 0900 Gross per 24 hour  Intake 1533.89 ml  Output 320 ml  Net 1213.89 ml   Filed Weights   11/30/20 0210  Weight: 85 kg    Examination: Physical exam: General: Crtitically ill-appearing female, lying on the bed HEENT: [place]/AT, eyes anicteric.  Dry mucous membranes, neck collar on Neuro: Eyes open, mimicking response, antigravity on left side, flexor response on right lower extremity, right upper extremity is plegic.  Remain aphasic  Chest: Tender to touch on right side side, coarse breath sounds, no wheezes or rhonchi Heart: Regular rate and  rhythm, no murmurs or gallops Abdomen: Soft, nontender, nondistended, bowel sounds present Skin: No rash, multiple bruise marks all over the body  Resolved Hospital Problem list     Assessment & Plan:  Acute left MCA/ACA territory ischemic stroke Cerebral edema with minimal midline shift Acute right clavicular fracture Induced hypernatremia Hypomagnesemia Alcohol abuse Bipolar disorder  Continue neuro watch every hour Management per stroke team Continue secondary stroke prophylaxis Continue aspirin and atorvastatin Repeat head CT showed slight increased cytotoxic cerebral edema with 2 mm midline shift on right Continue 3% saline at 50 cc an hour, serum sodium is trending up now its 151 Monitor serum sodium every 6 hours with goal 155-160 Continue CIWA protocol Continue IV thiamine and IV fluid Holding antipsychotic meds for now Orthopedic consulted for right clavicle fracture Continue to supplement electrolytes and monitor  Best Practice (right click and "Reselect all SmartList Selections" daily)   Diet/type: NPO DVT prophylaxis: LMWH GI prophylaxis: N/A Lines: N/A Foley:  N/A Code Status:  DNR Last date of multidisciplinary goals of care discussion [7/4: Spoke with patient's son and daughter at bedside, explained and updated about patient's current condition.  They would like to keep patient DNR and continue full aggressive care, if patient requires endotracheal intubation for cerebral edema and unable to protect her airway, they would like to proceed with it]  Labs   CBC: Recent Labs  Lab Dec 10, 2020 2358 11/30/20 0007 11/30/20 0317  WBC 13.6*  --  13.1*  NEUTROABS 11.7*  --   --   HGB 14.1 14.6 13.8  HCT 42.6  43.0 39.3  MCV 110.9*  --  106.2*  PLT 351  --  353    Basic Metabolic Panel: Recent Labs  Lab 12/19/20 2358 11/30/20 0007 11/30/20 1337 11/30/20 1812 12/01/20 0013 12/01/20 0619  NA 140 141 143 144 149* 151*  K 4.2 4.1  --   --   --   --   CL 105  105  --   --   --   --   CO2 24  --   --   --   --   --   GLUCOSE 117* 116*  --   --   --   --   BUN 11 11  --   --   --   --   CREATININE 1.05* 0.90  --   --   --   --   CALCIUM 8.5*  --   --   --   --   --   MG  --   --  1.5*  --   --   --   PHOS  --   --  3.2  --   --   --    GFR: Estimated Creatinine Clearance: 72.5 mL/min (by C-G formula based on SCr of 0.9 mg/dL). Recent Labs  Lab 12-19-2020 2358 11/30/20 0317  WBC 13.6* 13.1*    Liver Function Tests: Recent Labs  Lab 12/19/20 2358  AST 31  ALT 20  ALKPHOS 101  BILITOT 2.3*  PROT 6.9  ALBUMIN 3.0*   No results for input(s): LIPASE, AMYLASE in the last 168 hours. No results for input(s): AMMONIA in the last 168 hours.  ABG    Component Value Date/Time   TCO2 26 11/30/2020 0007     Coagulation Profile: Recent Labs  Lab 12-19-2020 2358  INR 1.1    Cardiac Enzymes: Recent Labs  Lab 11/30/20 1337  CKTOTAL 121    HbA1C: Hgb A1c MFr Bld  Date/Time Value Ref Range Status  11/30/2020 03:17 AM 5.4 4.8 - 5.6 % Final    Comment:    (NOTE) Pre diabetes:          5.7%-6.4%  Diabetes:              >6.4%  Glycemic control for   <7.0% adults with diabetes     CBG: Recent Labs  Lab 11/30/20 0006 11/30/20 1625  GLUCAP 124* 116*    Total critical care time: 36 minutes  Performed by: Cheri Fowler   Critical care time was exclusive of separately billable procedures and treating other patients.   Critical care was necessary to treat or prevent imminent or life-threatening deterioration.   Critical care was time spent personally by me on the following activities: development of treatment plan with patient and/or surrogate as well as nursing, discussions with consultants, evaluation of patient's response to treatment, examination of patient, obtaining history from patient or surrogate, ordering and performing treatments and interventions, ordering and review of laboratory studies, ordering and review of  radiographic studies, pulse oximetry and re-evaluation of patient's condition.   Cheri Fowler MD [facility] See Amion for pager If no response to pager, please call [phone] until 7pm After 7pm, Please call E-link [phone]

## 2020-12-01 NOTE — Progress Notes (Signed)
Attempted to call family for PICC consent but no answer.

## 2020-12-01 NOTE — Progress Notes (Addendum)
OT Cancellation Note  Patient Details Name: ZIKERIA KEOUGH MRN: [id number] DOB: Nov 19, 1960   Cancelled Treatment:    Reason Eval/Treat Not Completed: Active bedrest order OT order received and appreciated however this conflicts with current bedrest order set (3% saline/ CIWA protocol). Please increase activity tolerance as appropriate and remove bedrest from orders. . Please contact OT at [phone] if bed rest order is discontinued. OT will hold evaluation at this time and will check back as time allows pending increased activity orders.   Wynona Neat, OTR/L  Acute Rehabilitation Services Pager: [phone] Office: [phone] .  12/01/2020, 7:16 AM

## 2020-12-01 NOTE — Evaluation (Signed)
Physical Therapy Evaluation Patient Details Name: Rebecca Wilson MRN: [id number] DOB: September 17, 1960 Today's Date: 12/01/2020   History of Present Illness  60 yo female admitted 7/4, CT showed L MCA/ACA with cerebral edema 3% saline/ CIWA protocol. Fall OOB in ED, incidental finding of R clavical fx now NWB 2 weeks in sling 12/01/20, C-spine cleared. Global aphasia and Dense R hemiplegia. PMH ETOH abuse, anxiety, bipolar disorder, depression.  Clinical Impression   Pt presents with impaired communication and cognition, dense R hemiplegia with R inattention, impaired visual tracking to R with dysconjugate gaze, max difficulty performing mobility tasks, and decreased activity tolerance. Pt to benefit from acute PT to address deficits. Pt requiring max +2 assist for bed mobility and EOB sitting, pt promptly returning trunk to supine after ~2 minutes EOB. PT to progress mobility as tolerated, and will continue to follow acutely.      Follow Up Recommendations SNF;Supervision/Assistance - 24 hour    Equipment Recommendations  None recommended by PT    Recommendations for Other Services       Precautions / Restrictions Precautions Precautions: Fall Precaution Comments: R dense hemi , R UE sling x2 weeks to be further assessed by ortho Required Braces or Orthoses: Sling Restrictions Weight Bearing Restrictions: Yes RUE Weight Bearing: Non weight bearing      Mobility  Bed Mobility Overal bed mobility: Needs Assistance Bed Mobility: Rolling;Supine to Sit;Sit to Supine Rolling: Max assist   Supine to sit: +2 for physical assistance;Max assist Sit to supine: +2 for physical assistance;Max assist   General bed mobility comments: pt requires (A) to progress L side of the bed. pt moving L LE toward EOB and spontanously decides to return to supine with full body extension posterior requiring max (A) to control descent; pt moving L LE over RLE without any awareness to R LE.    Transfers                  General transfer comment: NT, pt self descends back to supine  Ambulation/Gait                Stairs            Wheelchair Mobility    Modified Rankin (Stroke Patients Only) Modified Rankin (Stroke Patients Only) Pre-Morbid Rankin Score: No symptoms Modified Rankin: Severe disability     Balance Overall balance assessment: Needs assistance Sitting-balance support: No upper extremity supported;Feet supported Sitting balance-Leahy Scale: Poor Sitting balance - Comments: requires min-mod truncal assist                                     Pertinent Vitals/Pain Pain Assessment: Faces Faces Pain Scale: Hurts little more Pain Location: LLE wounds, around knee Pain Descriptors / Indicators: Grimacing;Guarding Pain Intervention(s): Limited activity within patient's tolerance;Monitored during session;Repositioned    Home Living Family/patient expects to be discharged to:: Unsure                 Additional Comments: living with roommates found down after extended time    Prior Function Level of Independence: Independent               Hand Dominance   Dominant Hand:  (unknown)    Extremity/Trunk Assessment   Upper Extremity Assessment Upper Extremity Assessment: Defer to OT evaluation RUE Deficits / Details: flaccid in sling no response to painful stimuli RUE Sensation: decreased proprioception;decreased light touch  RUE Coordination: decreased fine motor;decreased gross motor    Lower Extremity Assessment Lower Extremity Assessment: Generalized weakness;RLE deficits/detail RLE Deficits / Details: 0/5 observed strength, + reflexive withdrawal to painful stimuli RLE Sensation: decreased light touch RLE Coordination: decreased fine motor;decreased gross motor    Cervical / Trunk Assessment Cervical / Trunk Assessment: Normal  Communication   Communication: Receptive difficulties;Expressive difficulties  Cognition  Arousal/Alertness: Awake/alert Behavior During Therapy: Flat affect Overall Cognitive Status: Difficult to assess Area of Impairment: Attention;Following commands;Awareness                   Current Attention Level: Focused   Following Commands: Follows one step commands inconsistently   Awareness:  (not focused attention / arousal attention at times)   General Comments: pt not following commands. pt mimicks motion/actions after a few attempts, showing more global aphasic response. pt does not make any sounds during session. Significant R inattention, eyes do not cross midline.      General Comments General comments (skin integrity, edema, etc.): pt with reddened, circular markings all over back, legs; RN aware.    Exercises     Assessment/Plan    PT Assessment Patient needs continued PT services  PT Problem List Decreased strength;Decreased coordination;Pain;Decreased activity tolerance;Decreased balance;Decreased mobility;Decreased knowledge of use of DME;Impaired sensation;Decreased safety awareness;Decreased knowledge of precautions;Decreased cognition;Decreased skin integrity       PT Treatment Interventions DME instruction;Therapeutic exercise;Gait training;Balance training;Functional mobility training;Wheelchair mobility training;Neuromuscular re-education;Therapeutic activities;Cognitive remediation;Patient/family education    PT Goals (Current goals can be found in the Care Plan section)  Acute Rehab PT Goals Patient Stated Goal: none stated PT Goal Formulation: With patient Time For Goal Achievement: 12/15/20 Potential to Achieve Goals: Fair    Frequency Min 3X/week   Barriers to discharge        Co-evaluation PT/OT/SLP Co-Evaluation/Treatment: Yes Reason for Co-Treatment: Complexity of the patient's impairments (multi-system involvement);Necessary to address cognition/behavior during functional activity;For patient/therapist safety;To address  functional/ADL transfers PT goals addressed during session: Mobility/safety with mobility;Balance OT goals addressed during session: ADL's and self-care;Proper use of Adaptive equipment and DME;Strengthening/ROM       AM-PAC PT "6 Clicks" Mobility  Outcome Measure Help needed turning from your back to your side while in a flat bed without using bedrails?: A Lot Help needed moving from lying on your back to sitting on the side of a flat bed without using bedrails?: Total Help needed moving to and from a bed to a chair (including a wheelchair)?: Total Help needed standing up from a chair using your arms (e.g., wheelchair or bedside chair)?: Total Help needed to walk in hospital room?: Total Help needed climbing 3-5 steps with a railing? : Total 6 Click Score: 7    End of Session Equipment Utilized During Treatment: Other (comment) (RUE sling) Activity Tolerance: Patient limited by fatigue Patient left: in bed;with call bell/phone within reach;with bed alarm set;with SCD's reapplied Nurse Communication: Mobility status PT Visit Diagnosis: Hemiplegia and hemiparesis Hemiplegia - Right/Left: Right Hemiplegia - dominant/non-dominant: Dominant Hemiplegia - caused by: Cerebral infarction    Time: 1829-9371 PT Time Calculation (min) (ACUTE ONLY): 15 min   Charges:   PT Evaluation $PT Eval Low Complexity: 1 Low          Marye Round, PT DPT Acute Rehabilitation Services Pager [phone]  Office [phone]   Truddie Coco 12/01/2020, 5:36 PM

## 2020-12-01 NOTE — Evaluation (Signed)
Occupational Therapy Evaluation Patient Details Name: Rebecca Wilson MRN: [id number] DOB: 11-11-1960 Today's Date: 12/01/2020    History of Present Illness 60 yo female admitted 7/4 CT showed L MCA/ACA with cerebral edema 3% saline/ CIWA protocol Incidental finding of R clavical fx now NWB 2 weeks in sling 12/01/20 Global aphasic and Dense R hemiplegia PMH ETOH abuse anxiety bipolar disorder depression   Clinical Impression   PT admitted with CVA with R clavicle fx. Pt currently with functional limitiations due to the deficits listed below (see OT problem list). Pt currently appears to have global aphasia and total +2 (A) for all adls/ iadls. Pt dysconjugate gaze does not pass midline. Pt with tone in R LE and does respond to painful stimuli / RUE flaccid.  Pt will benefit from skilled OT to increase their independence and safety with adls and balance to allow discharge SNF.     Follow Up Recommendations  SNF    Equipment Recommendations  3 in 1 bedside commode;Wheelchair (measurements OT);Wheelchair cushion (measurements OT);Hospital bed    Recommendations for Other Services Speech consult     Precautions / Restrictions Precautions Precautions: Fall Precaution Comments: R dense hemi , R UE sling x2 weeks to be further assessed by ortho Restrictions Weight Bearing Restrictions: Yes RUE Weight Bearing: Non weight bearing      Mobility Bed Mobility Overal bed mobility: Needs Assistance Bed Mobility: Rolling;Supine to Sit;Sit to Supine Rolling: Max assist   Supine to sit: +2 for physical assistance;Max assist Sit to supine: +2 for physical assistance;Max assist   General bed mobility comments: pt requires (A) to progress L side of the bed. pt moving L LE toward EOB and spontanously decides to return to supine with full body extension posterior requiring max (A) to control descend pt moving L LE over RLE without any awareness to R LE. pt literally attempting to bridge LLE in a figure  4 like position due to lack of awareness    Transfers                 General transfer comment: NT pt self descends back to supine    Balance Overall balance assessment: Needs assistance Sitting-balance support: No upper extremity supported;Feet supported Sitting balance-Leahy Scale: Poor                                     ADL either performed or assessed with clinical judgement   ADL Overall ADL's : Needs assistance/impaired                                       General ADL Comments: total (A) for all adls. pt noted to have a skin rash like appears all over body and in certain agains in a circle pattern ( ring) Rn made aware of findings. pt with cut L knee cap area     Vision   Vision Assessment?: Vision impaired- to be further tested in functional context Additional Comments: pt to have dysconjugate gaze. pt unable to track to midline.pt closing eyes 50% of session     Perception Perception Perception Tested?: Yes Perception Deficits: Inattention/neglect Inattention/Neglect: Does not attend to right visual field;Does not attend to right side of body   Praxis      Pertinent Vitals/Pain Pain Assessment: No/denies pain     Hand  Dominance  (unknown)   Extremity/Trunk Assessment Upper Extremity Assessment Upper Extremity Assessment: RUE deficits/detail RUE Deficits / Details: flaccid in sling no response to painful stimuli RUE Sensation: decreased proprioception;decreased light touch RUE Coordination: decreased fine motor;decreased gross motor   Lower Extremity Assessment Lower Extremity Assessment: Defer to PT evaluation   Cervical / Trunk Assessment Cervical / Trunk Assessment: Normal   Communication Communication Communication: Receptive difficulties;Expressive difficulties   Cognition Arousal/Alertness: Awake/alert Behavior During Therapy: Flat affect Overall Cognitive Status: Difficult to assess Area of Impairment:  Attention;Following commands;Awareness                   Current Attention Level: Focused   Following Commands: Follows one step commands inconsistently   Awareness:  (not focused attention / arousal attention at times)   General Comments: pt not following commands. pt will copy a motion after a few attempts showing more global aphasic response. pt does not make any sounds during session   General Comments  BP montiors and sustains stable below SBP <180 with saline running.    Exercises     Shoulder Instructions      Home Living Family/patient expects to be discharged to:: Unsure                                 Additional Comments: living with roommates found down after extended time      Prior Functioning/Environment Level of Independence: Independent                 OT Problem List: Decreased strength;Decreased range of motion;Impaired balance (sitting and/or standing);Decreased activity tolerance;Impaired vision/perception;Decreased coordination;Decreased cognition;Decreased safety awareness;Decreased knowledge of use of DME or AE;Decreased knowledge of precautions;Cardiopulmonary status limiting activity;Impaired sensation;Impaired tone;Impaired UE functional use      OT Treatment/Interventions: Self-care/ADL training;Therapeutic exercise;Neuromuscular education;Energy conservation;DME and/or AE instruction;Manual therapy;Therapeutic activities;Cognitive remediation/compensation;Visual/perceptual remediation/compensation;Patient/family education;Balance training    OT Goals(Current goals can be found in the care plan section) Acute Rehab OT Goals Patient Stated Goal: none stated OT Goal Formulation: Patient unable to participate in goal setting Time For Goal Achievement: 12/15/20 Potential to Achieve Goals: Fair  OT Frequency: Min 2X/week   Barriers to D/C: Decreased caregiver support  per chart pt was down in the home and assumed to have ETOH  reason for fall but later found to need medical attention and [facility] to [facility]       Co-evaluation PT/OT/SLP Co-Evaluation/Treatment: Yes Reason for Co-Treatment: To address functional/ADL transfers;For patient/therapist safety;Necessary to address cognition/behavior during functional activity;Complexity of the patient's impairments (multi-system involvement)   OT goals addressed during session: ADL's and self-care;Proper use of Adaptive equipment and DME;Strengthening/ROM      AM-PAC OT "6 Clicks" Daily Activity     Outcome Measure Help from another person eating meals?: Total Help from another person taking care of personal grooming?: Total Help from another person toileting, which includes using toliet, bedpan, or urinal?: Total Help from another person bathing (including washing, rinsing, drying)?: Total Help from another person to put on and taking off regular upper body clothing?: Total Help from another person to put on and taking off regular lower body clothing?: Total 6 Click Score: 6   End of Session Equipment Utilized During Treatment: Oxygen Nurse Communication: Mobility status;Precautions  Activity Tolerance: Patient tolerated treatment well Patient left: in bed;with bed alarm set;with call bell/phone within reach  OT Visit Diagnosis: Unsteadiness on feet (R26.81);Muscle weakness (generalized) (M62.81);Hemiplegia and  hemiparesis Hemiplegia - Right/Left: Right Hemiplegia - dominant/non-dominant: Dominant Hemiplegia - caused by: Other Nontraumatic intracranial hemorrhage                Time: 9326-7124 OT Time Calculation (min): 14 min Charges:  OT General Charges $OT Visit: 1 Visit OT Evaluation $OT Eval Moderate Complexity: 1 Mod   Brynn, OTR/L  Acute Rehabilitation Services Pager: [phone] Office: [phone] .   Mateo Flow 12/01/2020, 3:29 PM

## 2020-12-01 NOTE — TOC CAGE-AID Note (Signed)
Transition of Care [facility]) - CAGE-AID Screening   Patient Details  Name: Rebecca Wilson MRN: [id number] Date of Birth: November 03, 1960  [facility] of Care [facility] CM/SW Contact:    Mearl Latin, LCSW Phone Number: 12/01/2020, 10:04 AM   Clinical Narrative: Patient is disoriented and unable to participate in screening.    CAGE-AID Screening: Substance Abuse Screening unable to be completed due to: : Patient unable to participate

## 2020-12-01 NOTE — Progress Notes (Signed)
Orthopedic Tech Progress Note Patient Details:  Rebecca Wilson [birth year] [id number]  Ortho Devices Type of Ortho Device: Arm sling Ortho Device/Splint Location: RUE Ortho Device/Splint Interventions: Ordered, Application, Adjustment   Post Interventions Patient Tolerated: Well Instructions Provided: Care of device  Donald Pore 12/01/2020, 12:40 PM

## 2020-12-02 ENCOUNTER — Inpatient Hospital Stay (HOSPITAL_COMMUNITY): Payer: Medicare Other

## 2020-12-02 DIAGNOSIS — S2241XG Multiple fractures of ribs, right side, subsequent encounter for fracture with delayed healing: Secondary | ICD-10-CM | POA: Diagnosis not present

## 2020-12-02 DIAGNOSIS — I63512 Cerebral infarction due to unspecified occlusion or stenosis of left middle cerebral artery: Secondary | ICD-10-CM | POA: Diagnosis not present

## 2020-12-02 DIAGNOSIS — I6522 Occlusion and stenosis of left carotid artery: Secondary | ICD-10-CM | POA: Diagnosis not present

## 2020-12-02 LAB — COMPREHENSIVE METABOLIC PANEL
ALT: 24 U/L (ref 0–44)
AST: 45 U/L — ABNORMAL HIGH (ref 15–41)
Albumin: 2.5 g/dL — ABNORMAL LOW (ref 3.5–5.0)
Alkaline Phosphatase: 123 U/L (ref 38–126)
Anion gap: 3 — ABNORMAL LOW (ref 5–15)
BUN: 13 mg/dL (ref 6–20)
CO2: 31 mmol/L (ref 22–32)
Calcium: 8.2 mg/dL — ABNORMAL LOW (ref 8.9–10.3)
Chloride: 129 mmol/L — ABNORMAL HIGH (ref 98–111)
Creatinine, Ser: 0.77 mg/dL (ref 0.44–1.00)
GFR, Estimated: >60 mL/min (ref >60)
Glucose, Bld: 140 mg/dL — ABNORMAL HIGH (ref 70–99)
Potassium: 3.5 mmol/L (ref 3.5–5.1)
Sodium: 163 mmol/L (ref 135–145)
Total Bilirubin: 1.7 mg/dL — ABNORMAL HIGH (ref 0.3–1.2)
Total Protein: 6 g/dL — ABNORMAL LOW (ref 6.5–8.1)

## 2020-12-02 LAB — CBC
HCT: 34.9 % — ABNORMAL LOW (ref 36.0–46.0)
Hemoglobin: 11.3 g/dL — ABNORMAL LOW (ref 12.0–15.0)
MCH: 37.4 pg — ABNORMAL HIGH (ref 26.0–34.0)
MCHC: 32.4 g/dL (ref 30.0–36.0)
MCV: 115.6 fL — ABNORMAL HIGH (ref 80.0–100.0)
Platelets: 268 10*3/uL (ref 150–400)
RBC: 3.02 MIL/uL — ABNORMAL LOW (ref 3.87–5.11)
RDW: 17.4 % — ABNORMAL HIGH (ref 11.5–15.5)
WBC: 7.1 10*3/uL (ref 4.0–10.5)
nRBC: 0 % (ref 0.0–0.2)

## 2020-12-02 LAB — GLUCOSE, CAPILLARY
Glucose-Capillary: 118 mg/dL — ABNORMAL HIGH (ref 70–99)
Glucose-Capillary: 125 mg/dL — ABNORMAL HIGH (ref 70–99)
Glucose-Capillary: 130 mg/dL — ABNORMAL HIGH (ref 70–99)
Glucose-Capillary: 137 mg/dL — ABNORMAL HIGH (ref 70–99)
Glucose-Capillary: 126 mg/dL — ABNORMAL HIGH (ref 70–99)
Glucose-Capillary: 133 mg/dL — ABNORMAL HIGH (ref 70–99)

## 2020-12-02 LAB — SODIUM: Sodium: 156 mmol/L — ABNORMAL HIGH (ref 135–145)

## 2020-12-02 LAB — PHOSPHORUS
Phosphorus: 2.4 mg/dL — ABNORMAL LOW (ref 2.5–4.6)
Phosphorus: 4.5 mg/dL (ref 2.5–4.6)

## 2020-12-02 LAB — MAGNESIUM
Magnesium: 2.7 mg/dL — ABNORMAL HIGH (ref 1.7–2.4)
Magnesium: 2.4 mg/dL (ref 1.7–2.4)

## 2020-12-02 MED ORDER — POTASSIUM PHOSPHATES 15 MMOLE/5ML IV SOLN
30.0000 mmol | Freq: Once | INTRAVENOUS | Status: AC
  Administered 2020-12-02: 30 mmol via INTRAVENOUS
  Filled 2020-12-02: qty 10

## 2020-12-02 MED ORDER — ASPIRIN 81 MG PO CHEW: 81.0000 mg | CHEWABLE_TABLET | Freq: Every day | ORAL | Status: DC

## 2020-12-02 MED ORDER — ASPIRIN 81 MG PO CHEW
81.0000 mg | CHEWABLE_TABLET | Freq: Every day | ORAL | Status: DC
  Administered 2020-12-03: 81 mg
  Filled 2020-12-02: qty 1

## 2020-12-02 NOTE — Progress Notes (Signed)
STROKE TEAM PROGRESS NOTE   INTERVAL HISTORY Her  daughter is at the bedside. . She is  alert and interactive today and is attempting to speak and does follow simple pantomime commands..  Remains globally aphasic with dense right hemiplegia and right field cut  .  She is  now off  hypertonic saline  drip as serum sodium was above at goal at 160 today.   Vital signs are stable.  Speech therapy have her n.p.o. yet but plan modified barium swallow exam today. Vitals:   12/02/20 0800 12/02/20 0900 12/02/20 1000 12/02/20 1200  BP: (!) 167/99 (!) 147/87 138/84   Pulse: (!) 118 (!) 104 (!) 105   Resp: 19 16 17    Temp: 98 F (36.7 C)   100.1 F (37.8 C)  TempSrc: Axillary   Axillary  SpO2: 100% 100% 100%   Weight:      Height:       CBC:  Recent Labs  Lab 12-27-20 2358 11/30/20 0007 11/30/20 0317 12/02/20 0520  WBC 13.6*  --  13.1* 7.1  NEUTROABS 11.7*  --   --   --   HGB 14.1   < > 13.8 11.3*  HCT 42.6   < > 39.3 34.9*  MCV 110.9*  --  106.2* 115.6*  PLT 351  --  353 268   < > = values in this interval not displayed.   Basic Metabolic Panel:  Recent Labs  Lab Dec 27, 2020 2358 11/30/20 0007 11/30/20 1337 12/01/20 1659 12/01/20 2351 12/02/20 0520  NA 140 141   < > 157* 156* 163*  K 4.2 4.1  --   --   --  3.5  CL 105 105  --   --   --  129*  CO2 24  --   --   --   --  31  GLUCOSE 117* 116*  --   --   --  140*  BUN 11 11  --   --   --  13  CREATININE 1.05* 0.90  --   --   --  0.77  CALCIUM 8.5*  --   --   --   --  8.2*  MG  --   --    < > 3.3*  --  2.7*  PHOS  --   --    < > 3.0  --  2.4*   < > = values in this interval not displayed.   Lipid Panel:  Recent Labs  Lab 11/30/20 0317  CHOL 167  TRIG 195*  HDL 57  CHOLHDL 2.9  VLDL 39  LDLCALC 71   HgbA1c:  Recent Labs  Lab 11/30/20 0317  HGBA1C 5.4   Urine Drug Screen:  Recent Labs  Lab 11/30/20 1723  LABOPIA NONE DETECTED  COCAINSCRNUR NONE DETECTED  LABBENZ POSITIVE*  AMPHETMU NONE DETECTED  THCU NONE  DETECTED  LABBARB NONE DETECTED    Alcohol Level No results for input(s): ETH in the last 168 hours.  IMAGING past 24 hours DG Abd Portable 1V  Result Date: 12/01/2020 CLINICAL DATA:  Feeding tube placement EXAM: PORTABLE ABDOMEN - 1 VIEW COMPARISON:  None. FINDINGS: There is a weighted tip feeding tube overlying the mid abdomen, likely overlying the stomach. Bowel gas pattern is unremarkable. There is no acute osseous abnormality. The right hemidiaphragm is mildly elevated. Prior T12 vertebroplasty. Right upper quadrant surgical clips. IMPRESSION: Feeding tube tip overlies the stomach. Electronically Signed   By: 02/01/2021   On:  12/01/2020 16:35    PHYSICAL EXAM General: Unkempt frail middle-aged Caucasian lady chronically ill,  mod distress. Psych: n/a d/t lethergy Eyes: No scleral injection HENT: C-collar in place Head: Normocephalic.  Cardiovascular: Normal rate and regular rhythm.  Respiratory: Effort normal and breath sounds normal to anterior ascultation GI: Soft. Mild distension. There is no apparent tenderness.  Skin: multiple lesions, scabs, abrasions and bruising over all limbs and buttock.  Multiple scattered papular small circumscribed lesions all over extremities and body.-psoriatic lesions   Neurological Examination Mental Status: Awake and alert.  Globally aphasic and mute.  But will try to speak and make a few guttural sounds.  Follows only simple midline and a few one-step commands. Cranial Nerves: right facial weakness. PERRL. Some left gaze deviation with decreased blink. Unable to test EOM, shrugs or tongue d/t no cooperation.  Motor: decreased bulk, flaccid right hemiparesis. Withdraws to pain appropriately on left, decreased on right.  Purposeful antigravity movements on the left side. Plantars: Right: mute   Left: downgoing Cerebellar: unable Gait: unable  ASSESSMENT/PLAN Ms. ORMA CHEETHAM is a 60 y.o. female with history of anxiety, depression, EtOh abuse who  presents as a stroke code with R hemiplegia and aphasia. Found to have a large L MCA stroke with ASPECTS of 4. CT angio with L ICA occlusion at the origin and L MCA occlusion. Her Acomm is patent and she has good BL A1. Her L PCA originates from the basilar with no significant narrowing in the posterior circulation. She was outside the tPA window, not a candidate for thrombectomy due to ASPECTs score of 4 and what appears to be a completed L MCA stroke.   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  With mild cytotoxic edema and slight left right midline shift Code Stroke CT head LMCA stroke Small vessel disease. Atrophy. ASPECTS 4.    CTA head & neck Complete occlusion of LICA MRI  Large acute LMCA with widespread edema and some petechial hemorrhagic conversion.  MR C-spine: ?abnormal cord signal; mod spinal stenosis C5-6. No acute traumatic injury,  2D Echo ejection fraction 6065%.  No wall motion abnormalities. LDL 71 HgbA1c 5.4 VTE prophylaxis - lovenox    There are no active orders of the following types: Diet, Nourishments.   none  prior to admission, now on aspirin 300 mg suppository daily.  Therapy recommendations: Skilled nursing facility Disposition:  pending  Hypertension Home meds:  none Stable Permissive hypertension (OK if < 220/120) but gradually normalize in 5-7 days Long-term BP goal normotensive  Hyperlipidemia Home meds:  none LDL 71, goal < 70 High intensity statin not indicated Continue statin at discharge  Diabetes type II no dx HgbA1c 5.4, goal < 7.0 CBGs Recent Labs    12/02/20 0352 12/02/20 0758 12/02/20 1133  GLUCAP 118* 125* 130*    SSI  Other Stroke Risk Factors Cigarette smoker; advised to stop smoking Obesity, Body mass index is 26.19 kg/m., BMI >/= 30 associated with increased stroke risk, recommend weight loss, diet and exercise as appropriate  ETOH abuse  Other Active  Problems Anxiety/depression ETOH abuse- Thiamine and watch for withdrawals Clinically significant cerebral edema- ask CCM to consult and 3% NaCl has been started  Hospital day # 2 Patient remains globally aphasic with dense hemiplegia but with stable exam.  Hypertonic saline drip is off as serum sodium is very of goal and will likely trend down slowly naturally without adding  any free water.  Repeat CT scan of the head without contrast tomorrow and if he remains stable will consider transfer out of the ICU.  Speech therapy do modified barium swallow today if she fails she may need a core track tube..  Long discussion with patient's daughter   at the bedside about plan of care and answered questions.  Family wants full aggressive care including nursing home.  Discussed with Dr. Merrily Pew critical care medicine. This patient is critically ill and at significant risk of neurological worsening, death and care requires constant monitoring of vital signs, hemodynamics,respiratory and cardiac monitoring, extensive review of multiple databases, frequent neurological assessment, discussion with family, other specialists and medical decision making of high complexity.I have made any additions or clarifications directly to the above note.This critical care time does not reflect procedure time, or teaching time or supervisory time of PA/NP/Med Resident etc but could involve care discussion time.  I spent 30 minutes of neurocritical care time  in the care of  this patient.      Delia Heady, MD Medical Director [facility] Pager: [phone] 12/02/2020 12:40 PM   Delia Heady, MD Medical Director [facility] Pager: [phone] 12/02/2020 12:40 PM  To contact Stroke Continuity provider, please refer to [email or web address]. After hours, contact General Neurology

## 2020-12-02 NOTE — Progress Notes (Addendum)
NAME:  Rebecca Wilson, MRN:  [id number], DOB:  1960/06/23, LOS: 2 ADMISSION DATE:  2020/12/10, CONSULTATION DATE: 11/30/2020 REFERRING MD:  Reymundo Poll, MD, CHIEF COMPLAINT: Found down  History of Present Illness:  60 year old female with alcohol abuse, anxiety and bipolar disorder who was brought into the emergency department as a code stroke after she was found down.  In the emergency department patient was noted to have right-sided weakness and right facial paralysis, stroke code was called, patient underwent CT head which showed large MCA/ACA territory stroke with cerebral edema.  Per neurology patient was not candidate for tPA or thrombectomy.  Due to malignant cerebral edema, PCCM was consulted to help with management.  Significant Hospital Events: Including procedures, antibiotic start and stop dates in addition to other pertinent events   7/3 Admitted to ICU 7/5 Remains aphasic, mimicking behavior, Na 151, on 3%  Interim History / Subjective:  Afebrile  On 1L [place]  Glucose range 118-140 I/O 950 ml UOP, +910ml in last 24 hours  3% NS stopped due to elevated Na+.  Pending modified barium swallow (if she can participate)  Objective   Blood pressure (!) 149/77, pulse (!) 102, temperature 98.7 F (37.1 C), temperature source Oral, resp. rate 15, height 5\' 5"  (1.651 m), weight 71.4 kg, SpO2 92 %.        Intake/Output Summary (Last 24 hours) at 12/02/2020 0731 Last data filed at 12/02/2020 0700 Gross per 24 hour  Intake 1942.27 ml  Output 950 ml  Net 992.27 ml   Filed Weights   11/30/20 0210 12/02/20 0500  Weight: 85 kg 71.4 kg    Examination: General: adult female lying in bed, disheveled  HEENT: MM pink/dry, anicteric, pupils equal/reactive Neuro: eyes open, spontaneous movement on left, no spontaneous on right.  Aphasic.  Largely looks to the left. Does not follow commands.  CV: s1s2 RRR, ST on monitor, no m/r/g PULM: non-labored at rest, lungs bilaterally clear GI: soft,  bsx4 active  Extremities: warm/dry, no edema  Skin: multiple bruises on extremities, abrasion on left knee. Psoriatic lesions on legs  Resolved Hospital Problem list     Assessment & Plan:   Acute left MCA/ACA territory ischemic stroke Cytotoxic Cerebral Edema with 16mm Midline shift Acute right clavicular fracture Induced Hypernatremia Hypomagnesemia Hypophosphatemia Alcohol abuse Bipolar disorder Psoriasis  -neuro checks Q1 hour  -stroke plan per Neurology  -continue secondary stroke prophylaxis  -ASA, atorvastatin  -hold 3% for now given Na rise, repeat Na on schedule, restart per protocol  -follow Na+ Q6 -Na+ goal 155-160 -follow I/O's / UOP -CIWA protocol with PRN ativan, monitor for withdrawal symptoms -thiamine, folate, MVI  -appreciate Ortho evaluation for R clavicle fracture  -replace Phosphorous, K+ / electrolytes as appropriate  -hold antipsychotic medications for now -aspiration precautions  -TF per Nutrition  -SLP / PT / OT  -anticipate will need SNF placement    Best Practice (right click and "Reselect all SmartList Selections" daily)  Diet/type: NPO DVT prophylaxis: LMWH GI prophylaxis: N/A Lines: N/A Foley:  N/A Code Status:  DNR Last date of multidisciplinary goals of care discussion 7/4: Spoke with patient's son and daughter at bedside, explained and updated about patient's current condition.  They would like to keep patient DNR and continue full aggressive care, if patient requires endotracheal intubation for cerebral edema and unable to protect her airway, they would like to proceed with it.  Critical Care Time: 31 minutes    3m, MSN, APRN, NP-C, AGACNP-BC [place]  [facility] 12/02/2020, 7:31 AM   Please see [email or web address] for pager details.   From 7A-7P if no response, please call [phone] After hours, please call ELink [phone]

## 2020-12-02 NOTE — Progress Notes (Signed)
  Speech Language Pathology  Patient Details Name: Rebecca Wilson MRN: [id number] DOB: 10-15-1960 Today's Date: 12/02/2020 Time:  -     During attempt at [facility], therapist provided cup, straw, teaspoon of various textures to which she refused repeatedly turning head away, pursing lips attempting to use left hand to move therapist. Encouragement and techniques were not successful. No charge.ST will continue to work with pt at bedside.              [name] 12/02/2020, 5:05 PM  [name] [name].Ed Nurse, children's [phone] Office [phone]

## 2020-12-02 NOTE — Progress Notes (Signed)
Sodium 163 this AM. 3% infusion stopped per order.

## 2020-12-03 ENCOUNTER — Inpatient Hospital Stay (HOSPITAL_COMMUNITY): Payer: Medicare Other

## 2020-12-03 DIAGNOSIS — I63512 Cerebral infarction due to unspecified occlusion or stenosis of left middle cerebral artery: Secondary | ICD-10-CM | POA: Diagnosis not present

## 2020-12-03 DIAGNOSIS — I6522 Occlusion and stenosis of left carotid artery: Secondary | ICD-10-CM | POA: Diagnosis not present

## 2020-12-03 LAB — POCT I-STAT 7, (LYTES, BLD GAS, ICA,H+H)
Acid-Base Excess: 7 mmol/L — ABNORMAL HIGH (ref 0.0–2.0)
Bicarbonate: 33.9 mmol/L — ABNORMAL HIGH (ref 20.0–28.0)
Calcium, Ion: 1.31 mmol/L (ref 1.15–1.40)
HCT: 34 % — ABNORMAL LOW (ref 36.0–46.0)
Hemoglobin: 11.6 g/dL — ABNORMAL LOW (ref 12.0–15.0)
O2 Saturation: 75 %
Patient temperature: 100.3
Potassium: 4.8 mmol/L (ref 3.5–5.1)
Sodium: 150 mmol/L — ABNORMAL HIGH (ref 135–145)
TCO2: 36 mmol/L — ABNORMAL HIGH (ref 22–32)
pCO2 arterial: 60.7 mmHg — ABNORMAL HIGH (ref 32.0–48.0)
pH, Arterial: 7.359 (ref 7.350–7.450)
pO2, Arterial: 45 mmHg — ABNORMAL LOW (ref 83.0–108.0)

## 2020-12-03 LAB — BASIC METABOLIC PANEL
BUN: 9 mg/dL (ref 6–20)
CO2: 25 mmol/L (ref 22–32)
Calcium: 6.3 mg/dL — CL (ref 8.9–10.3)
Chloride: >130 mmol/L (ref 98–111)
Creatinine, Ser: 0.47 mg/dL (ref 0.44–1.00)
GFR, Estimated: >60 mL/min (ref >60)
Glucose, Bld: 112 mg/dL — ABNORMAL HIGH (ref 70–99)
Potassium: 2.8 mmol/L — ABNORMAL LOW (ref 3.5–5.1)
Sodium: 157 mmol/L — ABNORMAL HIGH (ref 135–145)

## 2020-12-03 LAB — GLUCOSE, CAPILLARY
Glucose-Capillary: 123 mg/dL — ABNORMAL HIGH (ref 70–99)
Glucose-Capillary: 134 mg/dL — ABNORMAL HIGH (ref 70–99)
Glucose-Capillary: 116 mg/dL — ABNORMAL HIGH (ref 70–99)
Glucose-Capillary: 102 mg/dL — ABNORMAL HIGH (ref 70–99)

## 2020-12-03 LAB — CBC
HCT: 32.6 % — ABNORMAL LOW (ref 36.0–46.0)
Hemoglobin: 10.1 g/dL — ABNORMAL LOW (ref 12.0–15.0)
MCH: 36.5 pg — ABNORMAL HIGH (ref 26.0–34.0)
MCHC: 31 g/dL (ref 30.0–36.0)
MCV: 117.7 fL — ABNORMAL HIGH (ref 80.0–100.0)
Platelets: 229 10*3/uL (ref 150–400)
RBC: 2.77 MIL/uL — ABNORMAL LOW (ref 3.87–5.11)
RDW: 17 % — ABNORMAL HIGH (ref 11.5–15.5)
WBC: 7.3 10*3/uL (ref 4.0–10.5)
nRBC: 0 % (ref 0.0–0.2)

## 2020-12-03 LAB — PHOSPHORUS: Phosphorus: 2.6 mg/dL (ref 2.5–4.6)

## 2020-12-03 LAB — MAGNESIUM: Magnesium: 1.7 mg/dL (ref 1.7–2.4)

## 2020-12-03 MED ORDER — LACTATED RINGERS IV SOLN: INTRAVENOUS | Status: DC

## 2020-12-03 MED ORDER — LORAZEPAM 0.5 MG PO TABS: 0.5000 mg | ORAL_TABLET | ORAL | Status: DC | PRN

## 2020-12-03 MED ORDER — HALOPERIDOL LACTATE 5 MG/ML IJ SOLN: 2.5000 mg | INTRAMUSCULAR | Status: DC | PRN

## 2020-12-03 MED ORDER — LORAZEPAM 2 MG/ML IJ SOLN
2.0000 mg | INTRAMUSCULAR | Status: DC | PRN
  Administered 2020-12-03: 2 mg via INTRAVENOUS
  Filled 2020-12-03: qty 1

## 2020-12-03 MED ORDER — POLYVINYL ALCOHOL 1.4 % OP SOLN: 1.0000 [drp] | Freq: Four times a day (QID) | OPHTHALMIC | Status: DC | PRN

## 2020-12-03 MED ORDER — GLYCOPYRROLATE 1 MG PO TABS
1.0000 mg | ORAL_TABLET | ORAL | Status: DC | PRN
  Filled 2020-12-03: qty 1

## 2020-12-03 MED ORDER — LORAZEPAM 0.5 MG PO TABS
0.5000 mg | ORAL_TABLET | ORAL | Status: DC | PRN
Start: 2020-12-03 — End: 2020-12-03
  Administered 2020-12-03: 2 mg via ORAL
  Filled 2020-12-03: qty 4

## 2020-12-03 MED ORDER — POTASSIUM CHLORIDE 10 MEQ/50ML IV SOLN
10.0000 meq | INTRAVENOUS | Status: AC
  Administered 2020-12-03 (×4): 10 meq via INTRAVENOUS
  Filled 2020-12-03 (×4): qty 50

## 2020-12-03 MED ORDER — HALOPERIDOL LACTATE 5 MG/ML IJ SOLN
INTRAMUSCULAR | Status: AC
  Filled 2020-12-03: qty 1

## 2020-12-03 MED ORDER — MORPHINE SULFATE (PF) 2 MG/ML IV SOLN
2.0000 mg | INTRAVENOUS | Status: DC | PRN
  Administered 2020-12-03: 2 mg via INTRAVENOUS
  Filled 2020-12-03: qty 1

## 2020-12-03 MED ORDER — ACETAMINOPHEN 325 MG PO TABS: 650.0000 mg | ORAL_TABLET | Freq: Four times a day (QID) | ORAL | Status: DC | PRN

## 2020-12-03 MED ORDER — MAGNESIUM SULFATE 2 GM/50ML IV SOLN
2.0000 g | Freq: Once | INTRAVENOUS | Status: AC
  Administered 2020-12-03: 2 g via INTRAVENOUS
  Filled 2020-12-03: qty 50

## 2020-12-03 MED ORDER — GLYCOPYRROLATE 0.2 MG/ML IJ SOLN
0.2000 mg | INTRAMUSCULAR | Status: DC | PRN
  Administered 2020-12-03: 0.2 mg via INTRAVENOUS

## 2020-12-03 MED ORDER — CALCIUM GLUCONATE-NACL 1-0.675 GM/50ML-% IV SOLN
1.0000 g | Freq: Once | INTRAVENOUS | Status: AC
  Administered 2020-12-03: 1000 mg via INTRAVENOUS
  Filled 2020-12-03: qty 50

## 2020-12-03 MED ORDER — HALOPERIDOL LACTATE 5 MG/ML IJ SOLN
2.0000 mg | Freq: Once | INTRAMUSCULAR | Status: AC
  Administered 2020-12-03: 2 mg via INTRAVENOUS

## 2020-12-03 MED ORDER — POTASSIUM CHLORIDE 20 MEQ PO PACK
40.0000 meq | PACK | Freq: Once | ORAL | Status: AC
  Administered 2020-12-03: 40 meq
  Filled 2020-12-03: qty 2

## 2020-12-03 MED ORDER — GLYCOPYRROLATE 0.2 MG/ML IJ SOLN
0.2000 mg | INTRAMUSCULAR | Status: DC | PRN
  Filled 2020-12-03: qty 1

## 2020-12-03 MED ORDER — LORAZEPAM 2 MG/ML IJ SOLN
1.0000 mg | INTRAMUSCULAR | Status: DC | PRN
  Administered 2020-12-03: 4 mg via INTRAVENOUS
  Filled 2020-12-03: qty 2

## 2020-12-03 MED ORDER — DIPHENHYDRAMINE HCL 50 MG/ML IJ SOLN: 25.0000 mg | INTRAMUSCULAR | Status: DC | PRN

## 2020-12-03 MED ORDER — ACETAMINOPHEN 650 MG RE SUPP: 650.0000 mg | Freq: Four times a day (QID) | RECTAL | Status: DC | PRN

## 2020-12-03 MED ORDER — THIAMINE HCL 100 MG PO TABS: 100.0000 mg | ORAL_TABLET | Freq: Every day | ORAL | Status: DC

## 2020-12-03 MED ORDER — DEXTROSE 5 % IV SOLN: INTRAVENOUS | Status: DC

## 2020-12-03 NOTE — Progress Notes (Signed)
eLink Physician-Brief Progress Note Patient Name: Rebecca Wilson DOB: 06-12-1960 MRN: [id number]   Date of Service  12/03/2020  HPI/Events of Note  Patient with agitated delirium secondary to ETOH withdrawal, there is a shortage of iv Ativan injection.  eICU Interventions  Oral Ativan orders entered.        Thomasene Lot Kalyssa Anker 12/03/2020, 12:04 AM

## 2020-12-03 NOTE — Progress Notes (Signed)
[facility] ADULT ICU REPLACEMENT PROTOCOL   The patient does apply for the [facility] Adult ICU Electrolyte Replacment Protocol based on the criteria listed below:   1. Is GFR >/= 30 ml/min? Yes.    Patient's GFR today is >60 2. Is SCr </= 2? Yes.   Patient's SCr is 0.47 ml/kg/hr 3. Did SCr increase >/= 0.5 in 24 hours? No. 4. Abnormal electrolyte(s): k (2.8),  (),  () 5. Ordered repletion with: protocol 6. If a panic level lab has been reported, has the CCM MD in charge been notified? No..   Physician:    Markus Daft A 12/03/2020 6:46 AM

## 2020-12-03 NOTE — Progress Notes (Signed)
eLink Physician-Brief Progress Note Patient Name: Rebecca Wilson DOB: 1960-11-23 MRN: [id number]   Date of Service  12/03/2020  HPI/Events of Note  Called to room for abnormal breathing pattern (RR 30-50/min). Patient is tachycardic to 160s. SBP 130s. She is obtunded. She withdraws to pain on left side only. Does not open eyes to sternal rub. SpO2 not picking up. Patient is on 10L [place].  Neurology notified by RN who recommended STAT NCHCT given her known L MCA/ACA stroke with cerebral edema and slight midline shift.   Of note, patient is DNI/DNR.   eICU Interventions  Agree with STAT NCHCT.  Obtain ABG first to ensure she is adequately saturating on 10L [place] (SpO2 not picking up).  Family to be updated by RN of clinical deterioration should they wish to come to the hospital.  Prognosis is extremely guarded.     Intervention Category Major Interventions: Respiratory failure - evaluation and management;Change in mental status - evaluation and management  Janae Bridgeman 12/03/2020, 8:22 PM

## 2020-12-03 NOTE — Progress Notes (Addendum)
PT Cancellation Note  Patient Details Name: Rebecca Wilson MRN: [id number] DOB: 12/19/1960   Cancelled Treatment:    Reason Eval/Treat Not Completed: Other (comment). RN reporting pt becomes increasingly agitated with all stimulation and requesting hold on PT at this time. Attempted 2x this date with no change. Will follow-up tomorrow.   Raymond Gurney, PT, DPT Acute Rehabilitation Services  Pager: [phone] Office: [phone]    [name] 12/03/2020, 10:06 AM

## 2020-12-03 NOTE — Progress Notes (Signed)
NAME:  Rebecca Wilson, MRN:  [id number], DOB:  07-21-1960, LOS: 3 ADMISSION DATE:  12/02/2020, CONSULTATION DATE: 11/30/2020 REFERRING MD:  Reymundo Poll, MD, CHIEF COMPLAINT: Found down  Brief Narrative:   60 year old female found down with code stroke called due to right sided weakness and right facial paralysis   CT head which showed large MCA/ACA territory stroke with cerebral edema.  Per neurology patient was not candidate for tPA or thrombectomy.  Due to malignant cerebral edema, PCCM was consulted to help with management.  Significant Hospital Events:  7/3 Admitted to ICU 7/5 Remains aphasic, mimicking behavior, Na 151, on 3% 7/7 Some issues with agitation due to ETOH withdrawal overnight   Interim History / Subjective:  Lying in bed encephalopathic Stable on [place]  Objective   Blood pressure (!) 130/94, pulse (!) 112, temperature 97.9 F (36.6 C), temperature source Axillary, resp. rate 19, height 5\' 5"  (1.651 m), weight 71.4 kg, SpO2 100 %.        Intake/Output Summary (Last 24 hours) at 12/03/2020 0804 Last data filed at 12/03/2020 0600 Gross per 24 hour  Intake 1629.26 ml  Output 625 ml  Net 1004.26 ml    Filed Weights   11/30/20 0210 12/02/20 0500  Weight: 85 kg 71.4 kg   Examination: General: Acute on chronically ill appearing unkept female lying in bed, in NAD HEENT: [place]/AT, MM pink/moist, PERRL,  Neuro: Encephalopathic, unable ot follow commands  CV: s1s2 regular rate and rhythm, no murmur, rubs, or gallops,  PULM:  faint expiratory wheeze, no increased work of breathing, oxygen saturations appropriate on [place] GI: soft, bowel sounds active in all 4 quadrants, non-tender, non-distended, tolerating TF Extremities: warm/dry, no edema  Skin: no rashes or lesions  Resolved Hospital Problem list   Hypomagnesemia  Hypophosphatemia  Assessment & Plan:   Acute left MCA/ACA territory ischemic stroke -MRI brain Large acute infarct affecting most of the Left MCA  territory with widespread cytotoxic edema  Cytotoxic Cerebral Edema with 37mm Midline shift -Head CT 7/5 with slightly increased cytotoxic edema within the left MCA territory with 2 mm of rightward midline shift P: Management per neurology  Frequent neuro checks  Secondary stroke prevention Continue ASA and stain   Maintain neuro protective measures Nutrition and bowel regiment  Seizure precautions  Aspirations precautions  PT/OT eval   Acute right clavicular fracture P: Orhto consulted and reccs received Maintain sling  NWB to right upper extremity  Patient will need to follow with Dr. 9/5 in 2 weeks  Induced Hypernatremia -Na currently 157 Hypokalemia  -K 2.8 P: 3% saline remains on hold, restart per Neuro  Supplement K  Trend Bmet   Alcohol abuse -History of 6-8 beers per night  P: CIWA  Supplement thiamine, folate, and multivitamin  Seizure precautions   Bipolar disorder -Home medications include Xanax, Remeron, Trazodone, and Vortioxetine HBr  P: Home antipsychotics on hold   PCCM will sign off. Thank you for the opportunity to participate in this patient's care. Please contact if we can be of further assistance.  Best Practice   Diet/type: tubefeeds DVT prophylaxis: LMWH GI prophylaxis: N/A Lines: N/A Foley:  N/A Code Status:  DNR Last date of multidisciplinary goals of care discussion 7/4: Spoke with patient's son and daughter at bedside, explained and updated about patient's current condition.  They would like to keep patient DNR and continue full aggressive care, if patient requires endotracheal intubation for cerebral edema and unable to protect her airway, they would like  to proceed with it.  Critical Care Time: NA  Jonavin Seder D. Tiburcio Pea, NP-C [facility] Personal contact information can be found on Amion  12/03/2020, 8:23 AM

## 2020-12-03 NOTE — Progress Notes (Signed)
Critical care attending attestation note:  Patient seen and examined and relevant ancillary tests reviewed.  I agree with the assessment and plan of care as outlined by Talitha Givens, NP . The following reflects my independent critical care time.   Synopsis of assessment and plan: 60 year old female here with left MCA/ACA territory stroke and cerebral edema with slight midline shift  Overnight patient was agitated, requiring Ativan and Haldol.  Serum sodium trended down to 157   Physical exam: General: Acutely ill ill-appearing female, lying on the bed HEENT: [place]/AT, eyes anicteric.  Dry mucous membranes Neuro: Lethargic, opens eyes with vocal stimuli, mimicking responses on left side, plegic right side Chest: Coarse breath sounds, no wheezes or rhonchi Heart: Tachycardic, regular rhythm, no murmurs or gallops Abdomen: Soft, nontender, nondistended, bowel sounds present Skin: No rash  Labs and images were reviewed  Assessment plan: Acute left MCA/ACA territory ischemic stroke Cytotoxic cerebral edema with midline shift Acute right clavicular fracture status post fall Induced hypernatremia Hypokalemia Hypocalcemia Hypomagnesemia Alcohol abuse Bipolar disorder  Continue secondary stroke prophylaxis Stroke team is following Patient is off 3% saline Serum sodium is 157 Continue neuro watch Repeat head CT showed stability and cerebral edema Orthopedic input is appreciated, patient has right shoulder sling Continue to monitor serum sodium Supplement electrolytes and repeat Continue thiamine and folate Holding antipsychotic meds   CRITICAL CARE Performed by: Cheri Fowler   Total independent critical care time: 32 minutes  Critical care time was exclusive of separately billable procedures and treating other patients.  Critical care was necessary to treat or prevent imminent or life-threatening deterioration.   Critical care was time spent personally by me on the  following activities: development of treatment plan with patient and/or surrogate as well as nursing, discussions with consultants, evaluation of patient's response to treatment, examination of patient, obtaining history from patient or surrogate, ordering and performing treatments and interventions, ordering and review of laboratory studies, ordering and review of radiographic studies, pulse oximetry, re-evaluation of patient's condition and participation in multidisciplinary rounds.  Cheri Fowler MD [place] [facility] See Amion for pager If no response to pager, please call [phone] until 7pm After 7pm, Please call E-link [phone]  12/03/2020, 10:05 AM

## 2020-12-03 NOTE — Progress Notes (Signed)
Rn walked into pt's room found pt with sonorus respirations, HR 150s, RR 40s, pt began to desat O2 increased to 10L. E-link and Neuro aware. Rt at bedside. New orders received. Pt placed on NRB. Pt is a DNR, pt's son was notified of pt's worsening condition. He stated that he will call his sister and they will come to bedside.   Pt currently unstable to take to CT, Dr. Ezzie Dural aware. Plan to take pt once she is more stable and can tolerate laying flat.

## 2020-12-03 NOTE — Progress Notes (Signed)
STROKE TEAM PROGRESS NOTE   INTERVAL HISTORY Patient was sedated with Haldol to obtain CT scan this morning stable appearance of the left MCA infarct with only 2 mm midline shift and cytotoxic edema.  Vital signs are stable.  Serum sodium is drifting down to 157.  Potassium is low at 2.8.  Calcium is low at 6.3 Vitals:   12/03/20 0900 12/03/20 1000 12/03/20 1100 12/03/20 1200  BP: (!) 141/121 106/68 102/70 127/83  Pulse: (!) 122 (!) 105 (!) 102 98  Resp: (!) 25 16 16 19   Temp:    97.8 F (36.6 C)  TempSrc:    Axillary  SpO2: 98% 96% 95% 98%  Weight:      Height:       CBC:  Recent Labs  Lab Dec 09, 2020 2358 11/30/20 0007 12/02/20 0520 12/03/20 0500  WBC 13.6*   < > 7.1 7.3  NEUTROABS 11.7*  --   --   --   HGB 14.1   < > 11.3* 10.1*  HCT 42.6   < > 34.9* 32.6*  MCV 110.9*   < > 115.6* 117.7*  PLT 351   < > 268 229   < > = values in this interval not displayed.   Basic Metabolic Panel:  Recent Labs  Lab 12/02/20 0520 12/02/20 1843 12/03/20 0500  NA 163*  --  157*  K 3.5  --  2.8*  CL 129*  --  >130*  CO2 31  --  25  GLUCOSE 140*  --  112*  BUN 13  --  9  CREATININE 0.77  --  0.47  CALCIUM 8.2*  --  6.3*  MG 2.7* 2.4 1.7  PHOS 2.4* 4.5 2.6   Lipid Panel:  Recent Labs  Lab 11/30/20 0317  CHOL 167  TRIG 195*  HDL 57  CHOLHDL 2.9  VLDL 39  LDLCALC 71   HgbA1c:  Recent Labs  Lab 11/30/20 0317  HGBA1C 5.4   Urine Drug Screen:  Recent Labs  Lab 11/30/20 1723  LABOPIA NONE DETECTED  COCAINSCRNUR NONE DETECTED  LABBENZ POSITIVE*  AMPHETMU NONE DETECTED  THCU NONE DETECTED  LABBARB NONE DETECTED    Alcohol Level No results for input(s): ETH in the last 168 hours.  IMAGING past 24 hours CT Head Wo Contrast  Result Date: 12/03/2020 CLINICAL DATA:  Stroke follow-up EXAM: CT HEAD WITHOUT CONTRAST TECHNIQUE: Contiguous axial images were obtained from the base of the skull through the vertex without intravenous contrast. COMPARISON:  Two days ago FINDINGS:  Brain: Large area of cytotoxic edema involving most of the left MCA territory. 2 mm midline shift without progression. No hemorrhagic conversion or evidence of new infarct. No hydrocephalus. Vascular: Stable Skull: Negative Sinuses/Orbits: Negative IMPRESSION: Left MCA territory infarct without hemorrhagic conversion or progressive mass effect. Electronically Signed   By: 02/03/2021 M.D.   On: 12/03/2020 09:25    PHYSICAL EXAM General: Unkempt frail middle-aged Caucasian lady chronically ill,  mod distress. Psych: n/a d/t lethergy Eyes: No scleral injection HENT: C-collar in place Head: Normocephalic.  Cardiovascular: Normal rate and regular rhythm.  Respiratory: Effort normal and breath sounds normal to anterior ascultation GI: Soft. Mild distension. There is no apparent tenderness.  Skin: multiple lesions, scabs, abrasions and bruising over all limbs and buttock.  Multiple scattered papular small circumscribed lesions all over extremities and body.-psoriatic lesions   Neurological Examination Mental Status: Awake and alert.  Globally aphasic and mute.  But will try to speak and make a  few guttural sounds.  Follows only simple midline and a few one-step commands. Cranial Nerves: right facial weakness. PERRL. Some left gaze deviation with decreased blink. Unable to test EOM, shrugs or tongue d/t no cooperation.  Motor: decreased bulk, flaccid right hemiparesis. Withdraws to pain appropriately on left, decreased on right.  Purposeful antigravity movements on the left side. Plantars: Right: mute   Left: downgoing Cerebellar: unable Gait: unable  ASSESSMENT/PLAN [name] is a 60 y.o. female with history of anxiety, depression, EtOh abuse who presents as a stroke code with R hemiplegia and aphasia. Found to have a large L MCA stroke with ASPECTS of 4. CT angio with L ICA occlusion at the origin and L MCA occlusion. Her Acomm is patent and she has good BL A1. Her L PCA originates  from the basilar with no significant narrowing in the posterior circulation. She was outside the tPA window, not a candidate for thrombectomy due to ASPECTs score of 4 and what appears to be a completed L MCA stroke.   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  With mild cytotoxic edema and slight left right midline shift Code Stroke CT head LMCA stroke Small vessel disease. Atrophy. ASPECTS 4.    CTA head & neck Complete occlusion of LICA MRI  Large acute LMCA with widespread edema and some petechial hemorrhagic conversion.  MR C-spine: ?abnormal cord signal; mod spinal stenosis C5-6. No acute traumatic injury,  2D Echo ejection fraction 6065%.  No wall motion abnormalities. LDL 71 HgbA1c 5.4 VTE prophylaxis - lovenox    There are no active orders of the following types: Diet, Nourishments.   none  prior to admission, now on aspirin 300 mg suppository daily.  Therapy recommendations: Skilled nursing facility Disposition:  pending  Hypertension Home meds:  none Stable Permissive hypertension (OK if < 220/120) but gradually normalize in 5-7 days Long-term BP goal normotensive  Hyperlipidemia Home meds:  none LDL 71, goal < 70 High intensity statin not indicated Continue statin at discharge  Diabetes type II no dx HgbA1c 5.4, goal < 7.0 CBGs Recent Labs    12/03/20 0328 12/03/20 0747 12/03/20 1130  GLUCAP 123* 134* 116*    SSI  Other Stroke Risk Factors Cigarette smoker; advised to stop smoking Obesity, Body mass index is 26.19 kg/m., BMI >/= 30 associated with increased stroke risk, recommend weight loss, diet and exercise as appropriate  ETOH abuse  Other Active Problems Anxiety/depression ETOH abuse- Thiamine and watch for withdrawals Clinically significant cerebral edema- ask CCM to consult and 3% NaCl has been started  Hospital day # 3 Patient remains globally aphasic with dense hemiplegia but with stable  exam.  Hypertonic saline drip is off as serum sodium is very of goal and will likely trend down slowly naturally without adding any free water.  Repeat CT scan of the head without contrast suggests that brain edema remains stable and she is off hypertonic saline drip hence we will consider transfer out of the ICU.  Patient was not cooperative with speech therapy for modified barium and she is still n.p.o. transfer back to internal medicine teaching team.     Family wants full aggressive care including nursing home.  Discussed with Dr. Merrily Pew critical care medicine. This patient is critically ill and at significant risk of neurological worsening, death and care requires constant monitoring of vital signs, hemodynamics,respiratory and cardiac monitoring, extensive review of multiple databases, frequent  neurological assessment, discussion with family, other specialists and medical decision making of high complexity.I have made any additions or clarifications directly to the above note.This critical care time does not reflect procedure time, or teaching time or supervisory time of PA/NP/Med Resident etc but could involve care discussion time.  I spent 30 minutes of neurocritical care time  in the care of  this patient.      Delia Heady, MD Medical Director [facility] Pager: [phone] 12/03/2020 2:09 PM   Delia Heady, MD Medical Director [facility] Pager: [phone] 12/03/2020 2:09 PM  To contact Stroke Continuity provider, please refer to [email or web address]. After hours, contact General Neurology

## 2020-12-03 NOTE — Progress Notes (Signed)
12/03/20 1030  Two critical lab values - Chloride >130.  Calcium -6.3 Notified Dr Warrick Parisian.  No new orders at this time.

## 2020-12-03 NOTE — Progress Notes (Signed)
eLink Physician-Brief Progress Note Patient Name: Rebecca Wilson DOB: [birth year] MRN: [id number]   Date of Service  12/03/2020  HPI/Events of Note  Goals of care discussion conducted by phone with patient's son and daughter. Explained that their mother has had a profound clinical deterioration that is very likely secondary to progression of her cerebral edema / herniation. She is hemodynamically unstable and with low PaO2 levels despite high levels of supplemental oxygen. The patient's son and daughter understood this and voiced their desire to transition to comfort focused care at this time.   eICU Interventions  Comfort measures orders entered.     Intervention Category Major Interventions: End of life / care limitation discussion  Janae Bridgeman 12/03/2020, 10:02 PM

## 2020-12-03 NOTE — Progress Notes (Signed)
SLP Cancellation Note  Patient Details Name: Rebecca Wilson MRN: [id number] DOB: 07/20/1960   Cancelled treatment:        Pt in stat CT. Plan to check status later this morning or afternoon for ability to participate in swallow or cognitive evals   [name] 12/03/2020, 9:08 AM

## 2020-12-04 DIAGNOSIS — I63512 Cerebral infarction due to unspecified occlusion or stenosis of left middle cerebral artery: Secondary | ICD-10-CM | POA: Diagnosis not present

## 2020-12-28 NOTE — Discharge Summary (Addendum)
Patient ID: Rebecca Wilson MRN: [id number] DOB/AGE: [birth year] 60 y.o.  Admit date: 2020/12/15 Death date: 12/20/2020 at 04:16 AM  Admission Diagnoses: Stroke code  Cause of Death: Respiratory failure due to large middle cerebral artery infarct with cytotoxic edema and suspected aspiration pneumonia due to left middle cerebral artery occlusion due to embolization from proximal left ICA occlusion.  Patient made DNR and comfort care but upon family request  Pertinent Medical Diagnosis: Active Problems:   Acute ischemic left MCA stroke (HCC)   ICAO (internal carotid artery occlusion), left   Stroke due to embolism of cerebral artery (HCC)   Stroke (cerebrum) (HCC) Cytotoxic edema Brain herniation Left middle cerebral artery occlusion Right clavicle fracture   Hospital Course: [name] has a past medical history significant for anxiety, depression, alcohol abuse who presented as a code stroke with sudden onset of aphasia and right hemiplegia.  Patient was unable to provide history which was obtained over the phone from patient's roommate.  She had history of drinking alcohol and occasionally passing out.  She was last seen normal the evening prior when she had return from liquor store. In the afternoon she was unresponsive in the presume it was due to alcohol.  CT scan on admission showed moderate to large left middle cerebral artery infarct with cytotoxic edema and mild midline shift.  CT angiogram showed left internal carotid artery occlusion at the origin as well as left middle cerebral artery occlusion lesion in the M1 segment.  CT scan showed a low aspect score of 4 hence she was not considered a candidate for thrombectomy.  She clearly had presented outside the time window for tPA and her NIH stroke scale on admission was very high at 24.  Patient was admitted initially to the floor but upon review of CT scan stroke team transferred her to the intensive care unit and she was started on  hypertonic saline.  She was initially protecting her airway and hence she was not intubated.  Critical care service was also consulted.  LDL cholesterol was 71 mg percent.  Hemoglobin A1c was 5.4.  Echocardiogram showed ejection fraction of 60 to 65%.  Serum sodium was kept in the 150-155 range and when it went above the range hypertonic saline drip was discontinued.  Follow-up CT scan on 12/03/2020 showed stable appearance of cytotoxic edema with only 2 mm shift.  However subsequently that day the patient's condition deteriorated she became more lethargic and developed respiratory distress with tachycardia.  Critical care team discussed with family goals of care and family agreed to initially DNR and subsequently comfort care.  Patient was kept comfortable and passed away. Signed: Delia Heady 12/10/2020, 8:55 AM

## 2020-12-28 NOTE — Progress Notes (Signed)
TOD [id number], verified with Jobie Quaker, RN.

## 2020-12-28 NOTE — Accreditation Note (Signed)
Restraints reported to CMS  Pursuant to regulation 482.13 (G) (3) use of restraints was logged and CMS was notified via electronic submission on 12/08/2020 at 1600.

## 2020-12-28 DEATH — deceased

## 2022-07-10 IMAGING — RF DG C-ARM 1-60 MIN
1 series · 1 of 1 positions shown · non-contrast
Comparison: Chest radiographs 08/27/2020.

CLINICAL DATA: Surgery, elective. Additional history provided:
Kyphoplasty T12. Provided fluoroscopy time 1 minutes, 59 seconds
(59.3 mGy).

EXAM:
OPERATIVE THORACIC SPINE 1 VIEW

[Series 1: run · 1 of 1 slices shown]
[im 1/1]
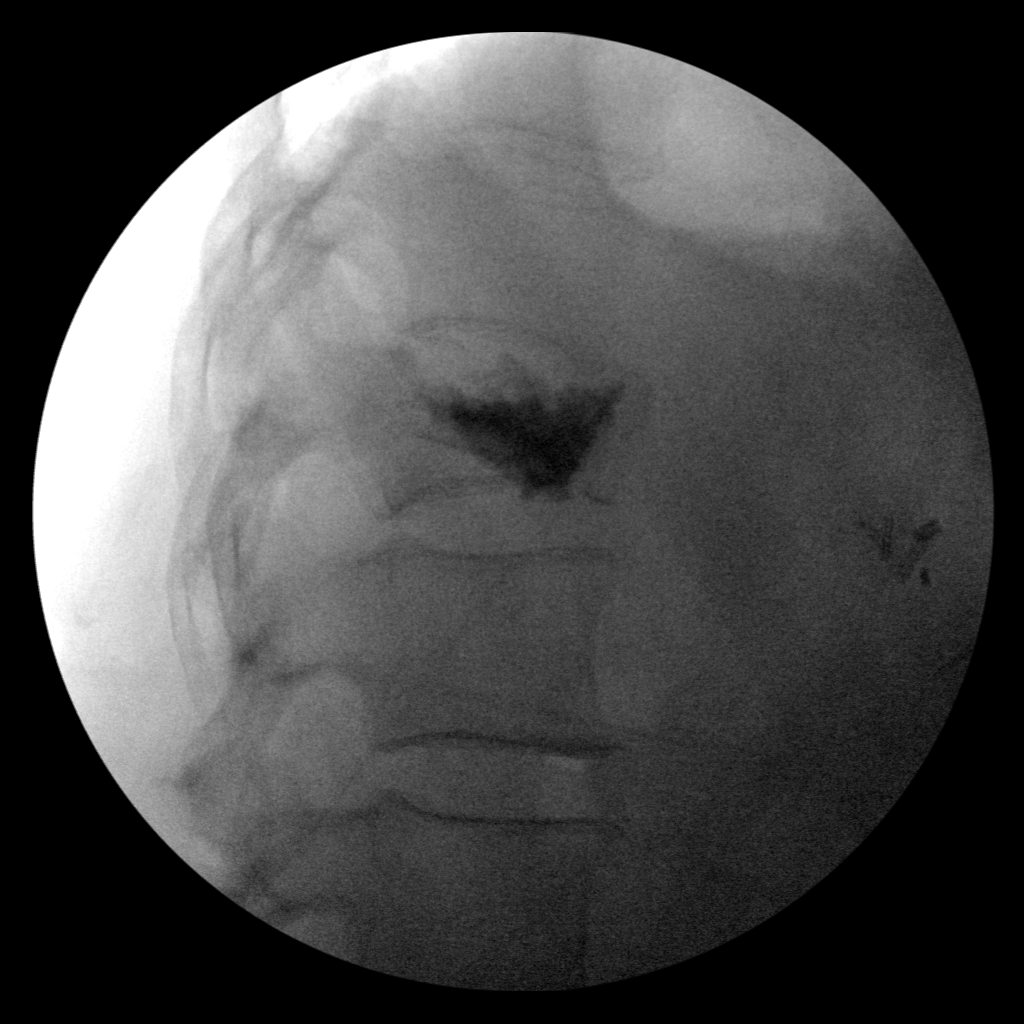

[1 of 1 positions shown; findings below may reference images not displayed]

FINDINGS: A single lateral view intraprocedural fluoroscopic image of the
thoracolumbar junction is submitted. The image demonstrates
kyphoplasty material within the T12 vertebral body, at site of a
known T12 vertebral compression fracture. No unexpected finding on
this single view.
IMPRESSION: Single lateral view intraoperative fluoroscopic image from T12
kyphoplasty, as described.

## 2022-10-16 IMAGING — CT CT HEAD W/O CM
4 series · 16 of 47 positions shown, 18 images · non-contrast
Comparison: Two days ago

CLINICAL DATA: Stroke follow-up

EXAM:
CT HEAD WITHOUT CONTRAST
TECHNIQUE: Contiguous axial images were obtained from the base of the skull
through the vertex without intravenous contrast.

[Series 3: head without · axial · non-contrast · 0.45mm/px · z∈[-88,+32]mm · 7 of 33 slices shown, 9 images]
[im 5/33  brain]
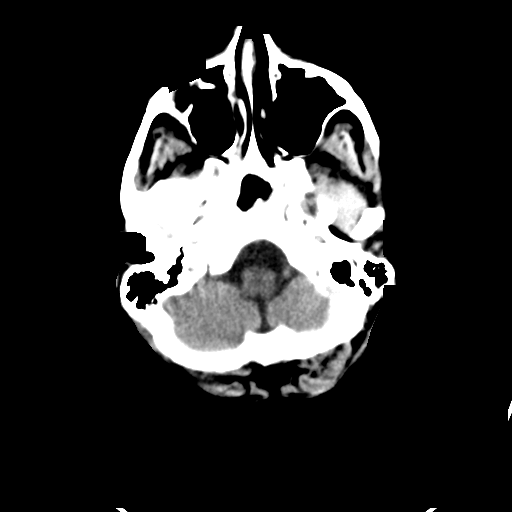
[im 5/33  bone]
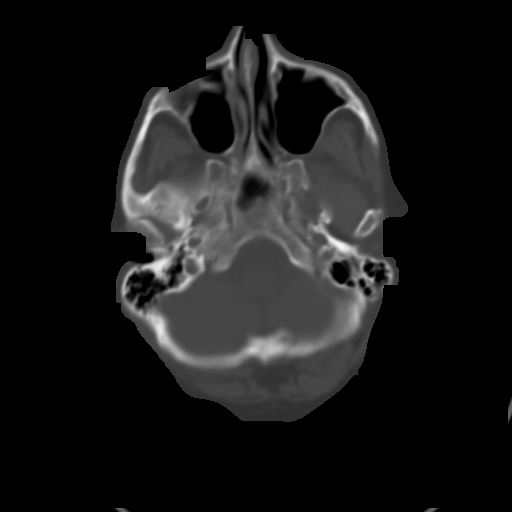
[im 9/33  brain]
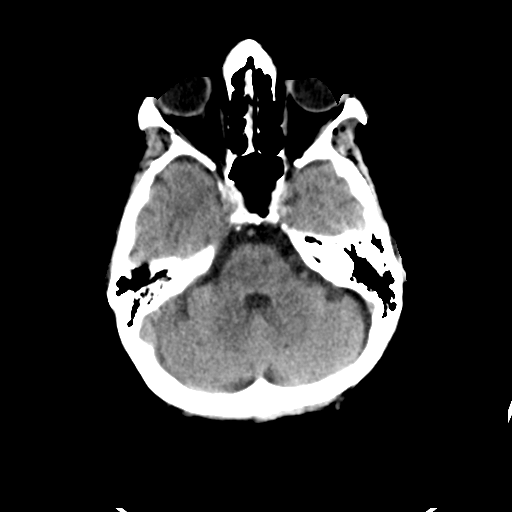
[im 13/33  brain]
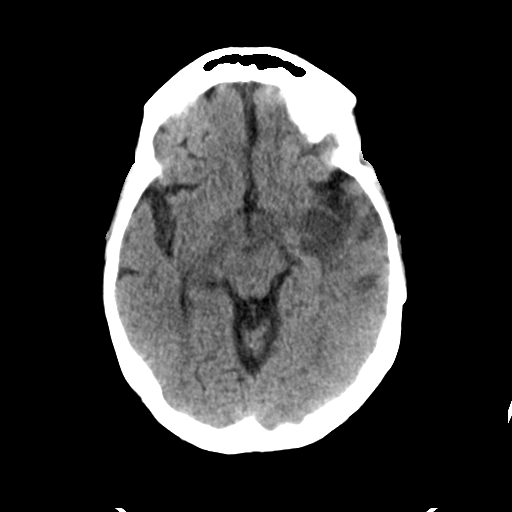
[im 17/33  brain]
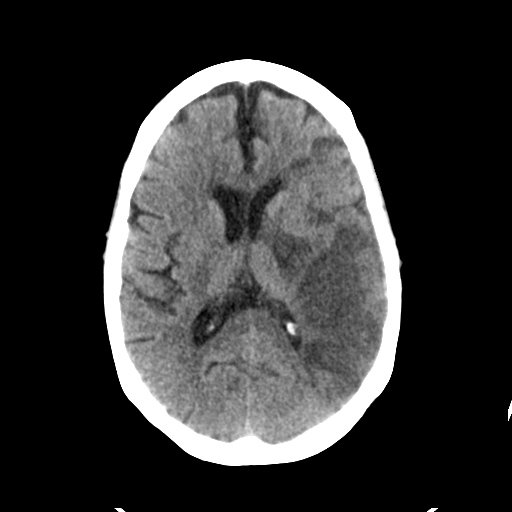
[im 21/33  brain]
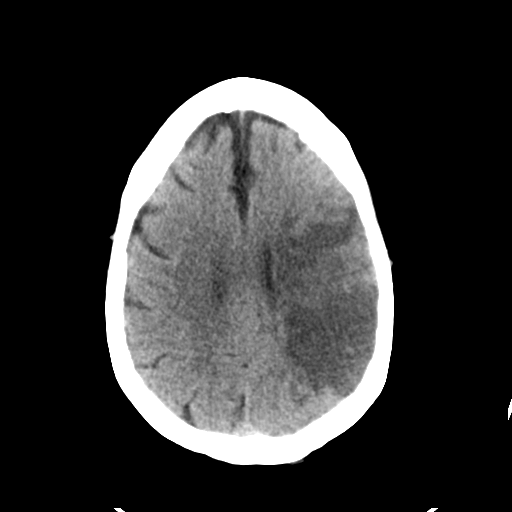
[im 21/33  bone]
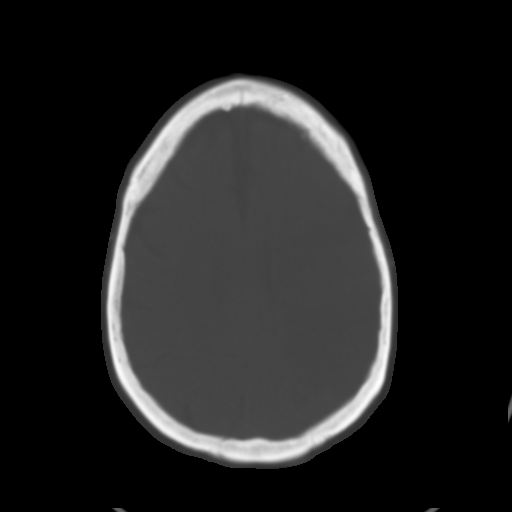
[im 25/33  brain]
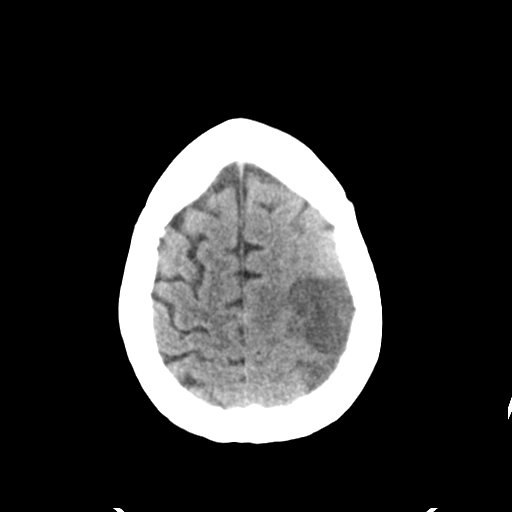
[im 29/33  brain]
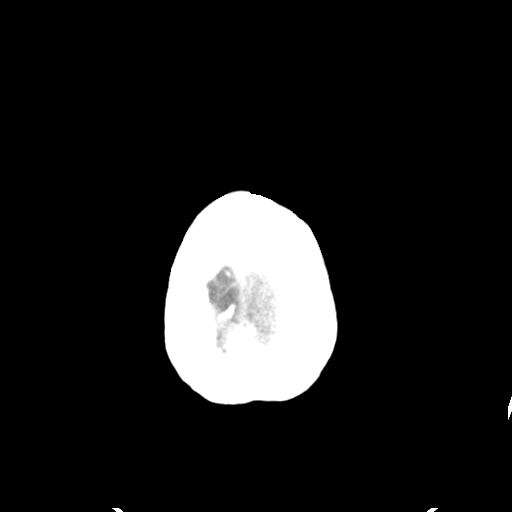

[Series 4: head bone · axial · 0.45mm/px · z∈[-92,-60]mm · 3 of 83 slices shown]
[im 9/83  bone]
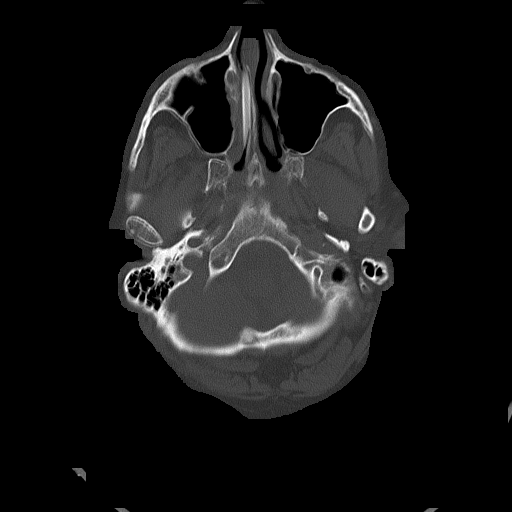
[im 17/83  bone]
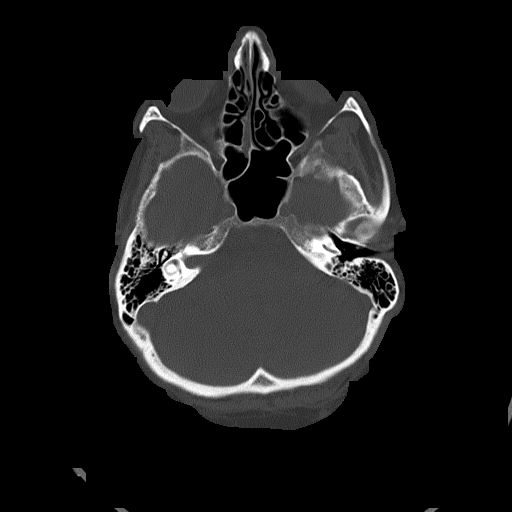
[im 25/83  bone]
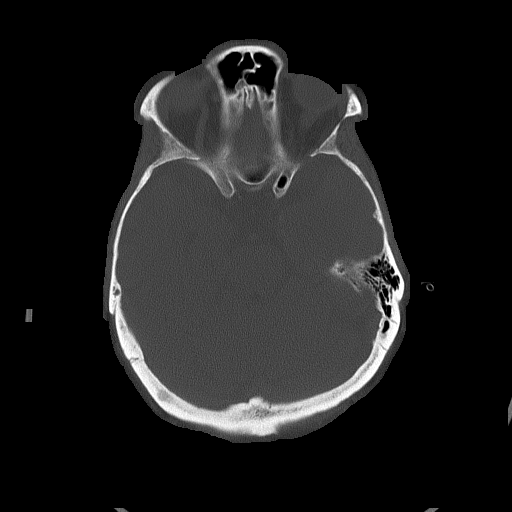

[Series 5: head without cor · coronal · non-contrast · 0.32mm/px · 3 of 65 slices shown]
[im 22/65  brain]
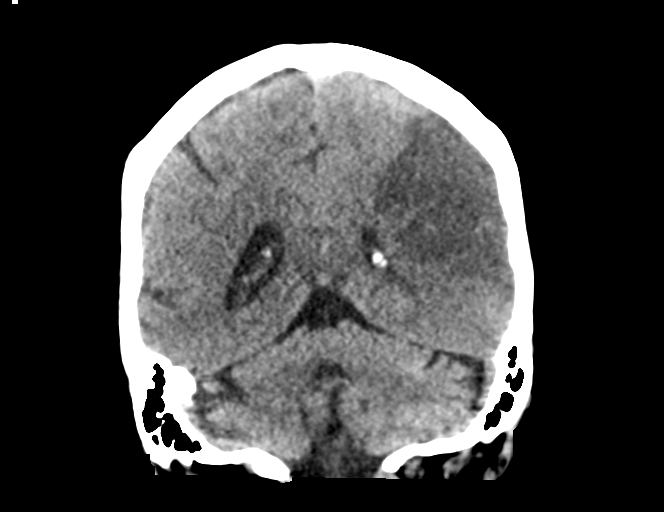
[im 29/65  brain]
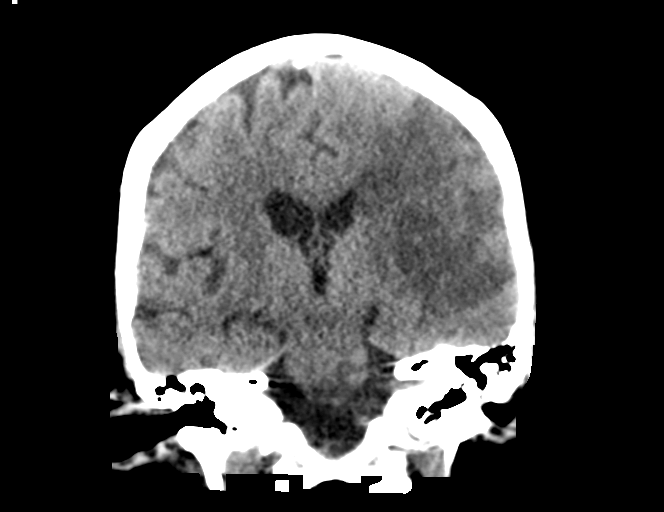
[im 36/65  brain]
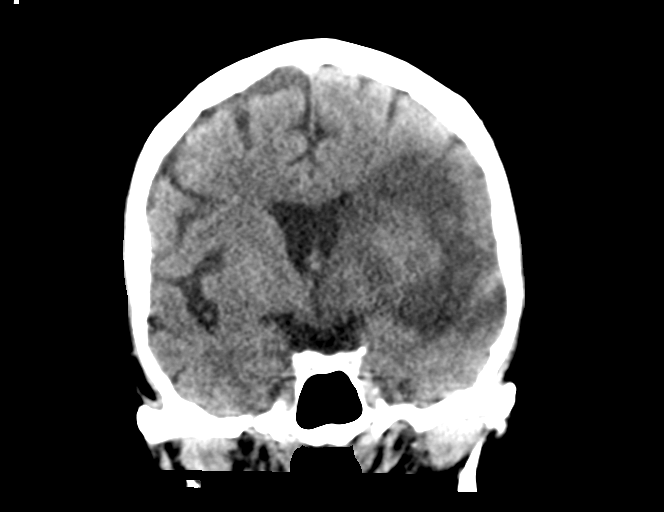

[Series 6: head without sag · sagittal · non-contrast · 0.32mm/px · 3 of 49 slices shown]
[im 17/49  brain]
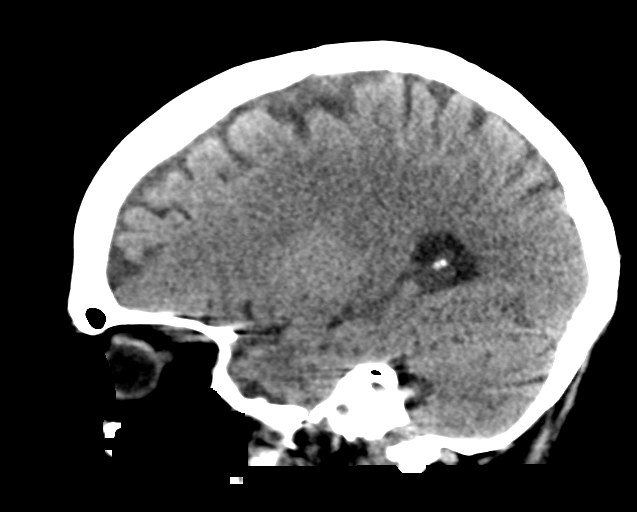
[im 25/49  brain]
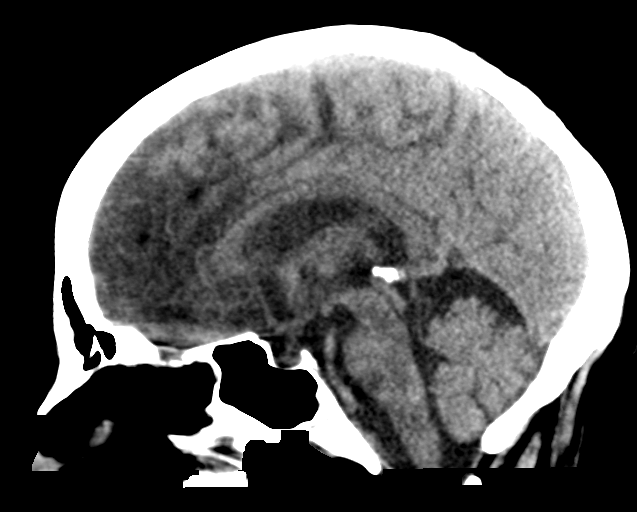
[im 33/49  brain]
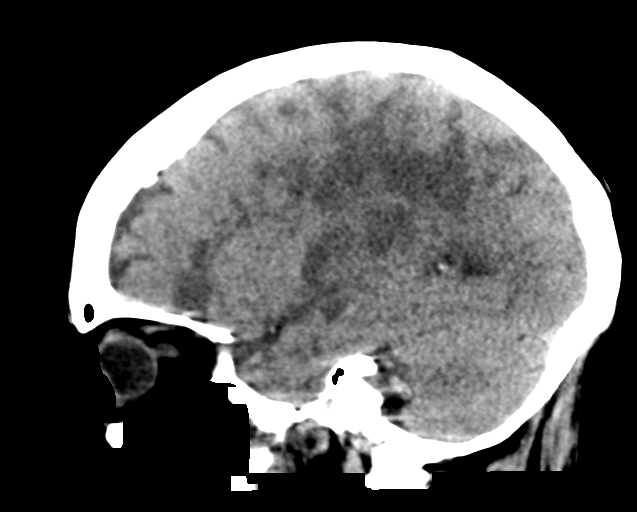

[16 of 47 positions shown; findings below may reference images not displayed]

FINDINGS: Brain: Large area of cytotoxic edema involving most of the left MCA
territory. 2 mm midline shift without progression. No hemorrhagic
conversion or evidence of new infarct. No hydrocephalus.

Vascular: Stable

Skull: Negative

Sinuses/Orbits: Negative
IMPRESSION: Left MCA territory infarct without hemorrhagic conversion or
progressive mass effect.
